# Patient Record
Sex: Female | Born: 1960 | Race: White | Hispanic: No | Marital: Married | Smoking: Current every day smoker
Health system: Southern US, Community
[De-identification: ages and names within clinical notes are randomized; demographics above are authoritative.]

## PROBLEM LIST (undated history)

## (undated) DIAGNOSIS — G459 Transient cerebral ischemic attack, unspecified: Secondary | ICD-10-CM

## (undated) DIAGNOSIS — I6529 Occlusion and stenosis of unspecified carotid artery: Secondary | ICD-10-CM

## (undated) DIAGNOSIS — I639 Cerebral infarction, unspecified: Secondary | ICD-10-CM

## (undated) DIAGNOSIS — I219 Acute myocardial infarction, unspecified: Secondary | ICD-10-CM

## (undated) DIAGNOSIS — G709 Myoneural disorder, unspecified: Secondary | ICD-10-CM

## (undated) DIAGNOSIS — I10 Essential (primary) hypertension: Secondary | ICD-10-CM

## (undated) DIAGNOSIS — M542 Cervicalgia: Secondary | ICD-10-CM

## (undated) DIAGNOSIS — M549 Dorsalgia, unspecified: Secondary | ICD-10-CM

## (undated) DIAGNOSIS — G8929 Other chronic pain: Secondary | ICD-10-CM

## (undated) HISTORY — DX: Occlusion and stenosis of unspecified carotid artery: I65.29

## (undated) HISTORY — PX: SPINAL FUSION: SHX223

## (undated) HISTORY — DX: Acute myocardial infarction, unspecified: I21.9

## (undated) HISTORY — PX: PLACEMENT OF BREAST IMPLANTS: SHX6334

## (undated) HISTORY — PX: COSMETIC SURGERY: SHX468

## (undated) HISTORY — DX: Cerebral infarction, unspecified: I63.9

## (undated) HISTORY — DX: Transient cerebral ischemic attack, unspecified: G45.9

## (undated) HISTORY — DX: Myoneural disorder, unspecified: G70.9

---

## 2015-08-09 DIAGNOSIS — M5010 Cervical disc disorder with radiculopathy, unspecified cervical region: Secondary | ICD-10-CM

## 2015-08-09 DIAGNOSIS — M5116 Intervertebral disc disorders with radiculopathy, lumbar region: Secondary | ICD-10-CM | POA: Insufficient documentation

## 2015-08-09 HISTORY — DX: Cervical disc disorder with radiculopathy, unspecified cervical region: M50.10

## 2019-03-11 DIAGNOSIS — M7060 Trochanteric bursitis, unspecified hip: Secondary | ICD-10-CM | POA: Insufficient documentation

## 2021-07-21 ENCOUNTER — Encounter (HOSPITAL_COMMUNITY): Payer: Self-pay

## 2021-07-21 ENCOUNTER — Other Ambulatory Visit: Payer: Self-pay

## 2021-07-21 ENCOUNTER — Emergency Department (HOSPITAL_COMMUNITY)
Admission: EM | Admit: 2021-07-21 | Discharge: 2021-07-22 | Disposition: A | Discharge status: Home / Self Care | Payer: Managed Care, Other (non HMO) | Attending: Emergency Medicine | Admitting: Emergency Medicine

## 2021-07-21 ENCOUNTER — Emergency Department (HOSPITAL_COMMUNITY): Payer: Managed Care, Other (non HMO)

## 2021-07-21 DIAGNOSIS — R079 Chest pain, unspecified: Secondary | ICD-10-CM | POA: Diagnosis not present

## 2021-07-21 DIAGNOSIS — R42 Dizziness and giddiness: Secondary | ICD-10-CM | POA: Diagnosis not present

## 2021-07-21 DIAGNOSIS — R110 Nausea: Secondary | ICD-10-CM | POA: Diagnosis not present

## 2021-07-21 DIAGNOSIS — I10 Essential (primary) hypertension: Secondary | ICD-10-CM | POA: Diagnosis not present

## 2021-07-21 DIAGNOSIS — Z5321 Procedure and treatment not carried out due to patient leaving prior to being seen by health care provider: Secondary | ICD-10-CM | POA: Diagnosis not present

## 2021-07-21 DIAGNOSIS — R519 Headache, unspecified: Secondary | ICD-10-CM | POA: Diagnosis present

## 2021-07-21 HISTORY — DX: Cervicalgia: M54.2

## 2021-07-21 HISTORY — DX: Other chronic pain: G89.29

## 2021-07-21 HISTORY — DX: Essential (primary) hypertension: I10

## 2021-07-21 HISTORY — DX: Dorsalgia, unspecified: M54.9

## 2021-07-21 NOTE — ED Provider Notes (Signed)
Emergency Medicine Provider Triage Evaluation Note  Samantha Russo , a 60 y.o. female  was evaluated in triage.  Pt complains of feeling unwell.  Patient states over the last 3 weeks she has had intermittent headaches, nausea, dizziness and chest pain.  Chest pain has since resolved.  Has not been seen by PCP in quite a long time.  Due to feeling unwell she went to urgent care today.  They obtained an EKG which showed some Q waves in her anterior leads.  They told her to come here to emergency department for evaluation.  She denies any paresthesias, unilateral weakness, facial droop.  No current chest pain, shortness of breath.  Unknown COVID exposures.  Review of Systems  Positive: Intermittent chest pain, dizziness, lightheadedness Negative: Fever, emesis, back pain, urinary complaints  Physical Exam  BP (!) 170/129 (BP Location: Left Arm)   Pulse (!) 113   Temp 98.8 F (37.1 C) (Oral)   Resp 16   Ht 5' 3.5" (1.613 m)   Wt 83.5 kg   LMP  (LMP Unknown)   SpO2 97%   BMI 32.08 kg/m  Gen:   Awake, no distress   Resp:  Normal effort  MSK:   Moves extremities without difficulty  Neuro:  CN 2-12 grossly intact, equal handgrip bilaterally, intact sensation Other:    Medical Decision Making  Medically screening exam initiated at 3:55 PM.  Appropriate orders placed.  Colbi Schiltz was informed that the remainder of the evaluation will be completed by another provider, this initial triage assessment does not replace that evaluation, and the importance of remaining in the ED until their evaluation is complete.  Intermittent CP, dizziness   Jennetta Flood A, PA-C 07/21/21 1556    Ernie Avena, MD 07/22/21 1445

## 2021-07-21 NOTE — ED Triage Notes (Signed)
Patient c/o headache, nausea, and dizziness x 3 weeks. Patient states that she had chest pain x 1 only approx 3 weeks ago. Patient states she has not had prescribed medications for HTN since March/2022.

## 2021-07-21 NOTE — ED Notes (Signed)
Pt sts she is leaving and will go see a cardiologist. She sts she cant wait any longer

## 2021-07-22 ENCOUNTER — Emergency Department (HOSPITAL_COMMUNITY): Payer: Managed Care, Other (non HMO)

## 2021-07-22 ENCOUNTER — Emergency Department (HOSPITAL_COMMUNITY)
Admission: EM | Admit: 2021-07-22 | Discharge: 2021-07-22 | Disposition: A | Discharge status: Home / Self Care | Payer: Managed Care, Other (non HMO) | Source: Home / Self Care

## 2021-07-22 DIAGNOSIS — R079 Chest pain, unspecified: Secondary | ICD-10-CM | POA: Insufficient documentation

## 2021-07-22 DIAGNOSIS — R42 Dizziness and giddiness: Secondary | ICD-10-CM | POA: Insufficient documentation

## 2021-07-22 DIAGNOSIS — R110 Nausea: Secondary | ICD-10-CM | POA: Insufficient documentation

## 2021-07-22 DIAGNOSIS — Z5321 Procedure and treatment not carried out due to patient leaving prior to being seen by health care provider: Secondary | ICD-10-CM | POA: Insufficient documentation

## 2021-07-22 LAB — CBC WITH DIFFERENTIAL/PLATELET
Abs Immature Granulocytes: 0.02 10*3/uL (ref 0.00–0.07)
Basophils Absolute: 0.1 10*3/uL (ref 0.0–0.1)
Basophils Relative: 1 %
Eosinophils Absolute: 0.1 10*3/uL (ref 0.0–0.5)
Eosinophils Relative: 1 %
HCT: 47 % — ABNORMAL HIGH (ref 36.0–46.0)
Hemoglobin: 15.6 g/dL — ABNORMAL HIGH (ref 12.0–15.0)
Immature Granulocytes: 0 %
Lymphocytes Relative: 34 %
Lymphs Abs: 2.7 10*3/uL (ref 0.7–4.0)
MCH: 30.4 pg (ref 26.0–34.0)
MCHC: 33.2 g/dL (ref 30.0–36.0)
MCV: 91.4 fL (ref 80.0–100.0)
Monocytes Absolute: 0.9 10*3/uL (ref 0.1–1.0)
Monocytes Relative: 11 %
Neutro Abs: 4.2 10*3/uL (ref 1.7–7.7)
Neutrophils Relative %: 53 %
Platelets: 365 10*3/uL (ref 150–400)
RBC: 5.14 MIL/uL — ABNORMAL HIGH (ref 3.87–5.11)
RDW: 13.9 % (ref 11.5–15.5)
WBC: 7.9 10*3/uL (ref 4.0–10.5)
nRBC: 0 % (ref 0.0–0.2)

## 2021-07-22 LAB — COMPREHENSIVE METABOLIC PANEL
ALT: 22 U/L (ref 0–44)
AST: 20 U/L (ref 15–41)
Albumin: 4 g/dL (ref 3.5–5.0)
Alkaline Phosphatase: 90 U/L (ref 38–126)
Anion gap: 11 (ref 5–15)
BUN: 15 mg/dL (ref 6–20)
CO2: 20 mmol/L — ABNORMAL LOW (ref 22–32)
Calcium: 9.3 mg/dL (ref 8.9–10.3)
Chloride: 106 mmol/L (ref 98–111)
Creatinine, Ser: 0.59 mg/dL (ref 0.44–1.00)
GFR, Estimated: >60 mL/min (ref >60)
Glucose, Bld: 107 mg/dL — ABNORMAL HIGH (ref 70–99)
Potassium: 4.4 mmol/L (ref 3.5–5.1)
Sodium: 137 mmol/L (ref 135–145)
Total Bilirubin: 0.5 mg/dL (ref 0.3–1.2)
Total Protein: 7.4 g/dL (ref 6.5–8.1)

## 2021-07-22 LAB — TROPONIN I (HIGH SENSITIVITY): Troponin I (High Sensitivity): 4 ng/L (ref <18)

## 2021-07-22 NOTE — ED Triage Notes (Signed)
Pt reports constant nausea and dizziness x 2.5 weeks, having intermittent chest pain with radiation to back for same amount of time. Seen for same yesterday but LWBS. Took a Valium last night with some relief in pain.

## 2021-07-22 NOTE — ED Provider Notes (Addendum)
Emergency Medicine Provider Triage Evaluation Note  Samantha Russo , a 60 y.o. female  was evaluated in triage.  Pt complains of persistent dizziness and nausea with intermittent CP/back pain. This has been happening for 2-3 weeks. Went to [facility] yesterday, but didn't wait to be seen. EKG was abnormal with UC yesterday. Dizziness is described as being off balance.   Pt with h/o heart problems, was on spironolactone and bistolic in [place], has not been able to get meds refilled due to no dr in [facility]. Has been out for 2 months.   Review of Systems  Positive: Cp, dizziness, nausea Negative: fever  Physical Exam  LMP  (LMP Unknown)  Gen:   Awake, no distress   Resp:  Normal effort  MSK:   Moves extremities without difficulty  Other:  Eomi. Alert and oriented. Moving all extrmeities equally  Medical Decision Making  Medically screening exam initiated at 9:56 AM.  Appropriate orders placed.  Samantha Russo was informed that the remainder of the evaluation will be completed by another provider, this initial triage assessment does not replace that evaluation, and the importance of remaining in the ED until their evaluation is complete.  Labs, cxr, ekg, and ct head ordered   Samantha Apley, PA-C 07/22/21 1007    Russo, Samantha Jiggetts, PA-C 07/22/21 1008    Tegeler, Canary Brim, MD 07/22/21 [phone]

## 2021-07-22 NOTE — ED Notes (Signed)
Pt handed over their labels and stated they were leaving

## 2021-07-24 DIAGNOSIS — G8929 Other chronic pain: Secondary | ICD-10-CM | POA: Insufficient documentation

## 2021-07-24 DIAGNOSIS — I10 Essential (primary) hypertension: Secondary | ICD-10-CM | POA: Insufficient documentation

## 2021-07-24 DIAGNOSIS — M542 Cervicalgia: Secondary | ICD-10-CM | POA: Insufficient documentation

## 2021-07-25 ENCOUNTER — Ambulatory Visit (INDEPENDENT_AMBULATORY_CARE_PROVIDER_SITE_OTHER): Payer: Managed Care, Other (non HMO) | Admitting: Cardiology

## 2021-07-25 ENCOUNTER — Encounter: Payer: Self-pay | Admitting: Cardiology

## 2021-07-25 ENCOUNTER — Other Ambulatory Visit: Payer: Self-pay

## 2021-07-25 VITALS — BP 156/84 | HR 104 | Ht 64.0 in | Wt 185.0 lb

## 2021-07-25 DIAGNOSIS — I10 Essential (primary) hypertension: Secondary | ICD-10-CM | POA: Diagnosis not present

## 2021-07-25 DIAGNOSIS — R0789 Other chest pain: Secondary | ICD-10-CM

## 2021-07-25 DIAGNOSIS — E782 Mixed hyperlipidemia: Secondary | ICD-10-CM

## 2021-07-25 DIAGNOSIS — F17200 Nicotine dependence, unspecified, uncomplicated: Secondary | ICD-10-CM

## 2021-07-25 DIAGNOSIS — R079 Chest pain, unspecified: Secondary | ICD-10-CM | POA: Diagnosis not present

## 2021-07-25 DIAGNOSIS — IMO0001 Reserved for inherently not codable concepts without codable children: Secondary | ICD-10-CM

## 2021-07-25 HISTORY — DX: Reserved for inherently not codable concepts without codable children: IMO0001

## 2021-07-25 HISTORY — DX: Other chest pain: R07.89

## 2021-07-25 HISTORY — DX: Nicotine dependence, unspecified, uncomplicated: F17.200

## 2021-07-25 MED ORDER — ASPIRIN EC 81 MG PO TBEC: 81.0000 mg | DELAYED_RELEASE_TABLET | Freq: Every day | ORAL | 3 refills | Status: DC

## 2021-07-25 MED ORDER — NEBIVOLOL HCL 5 MG PO TABS: 5.0000 mg | ORAL_TABLET | Freq: Every day | ORAL | 1 refills | Status: DC

## 2021-07-25 MED ORDER — NITROGLYCERIN 0.4 MG/SPRAY TL SOLN: 1.0000 | 12 refills | Status: DC | PRN

## 2021-07-25 MED ORDER — SPIRONOLACTONE 25 MG PO TABS: 25.0000 mg | ORAL_TABLET | Freq: Every day | ORAL | 1 refills | Status: DC

## 2021-07-25 MED ORDER — METOPROLOL TARTRATE 100 MG PO TABS
ORAL_TABLET | ORAL | 0 refills | Status: DC
Start: 2021-07-25 — End: 2021-07-25

## 2021-07-25 MED ORDER — ASPIRIN EC 81 MG PO TBEC: 81.0000 mg | DELAYED_RELEASE_TABLET | Freq: Every day | ORAL | 3 refills | Status: AC

## 2021-07-25 MED ORDER — METOPROLOL TARTRATE 100 MG PO TABS: ORAL_TABLET | ORAL | 0 refills | Status: DC

## 2021-07-25 NOTE — Progress Notes (Signed)
Cardiology Consultation:    Date:  07/25/2021   ID:  Samantha Russo, DOB [birth year], MRN [id number]  PCP:  Pcp, No  Cardiologist:  Gypsy Balsam, MD   Referring MD: No ref. provider found   Chief Complaint  Patient presents with   back and chest pain   Dizziness    Since 06/2021    History of Present Illness:    Samantha Russo is a 60 y.o. female who is being seen today for the evaluation of chest pain dizziness at the request of No ref. provider found.  She recently relocated from [place] in December.  She does have chronic neck pain that required surgery as well as back pain.  She is a chronic smoker smoke about 1 pack/day.  Couple weeks ago she was working hard she was going up and down stairs many times then she woke up in the middle of the night with severe chest pain.  There was some sweating associated with this sensation.  She interestingly decided to was this is with death which she did and eventually about an hour or maybe an hour and a half later the sensation subsided.  She did have going to the emergency room a couple days later because since that time she is feeling very dizzy unsteady and complain of having some uneasy sensation in the chest not related to exercise.  In emergency room troponin I has been checked she also get EKG done initially by primary care physician which showed Q waves inferiorly but then she got EKG done in the emergency room which raises suspicion for anterior septal wall myocardial infarction.  There were no acute changes at that time. She smoke about 1 pack/day She does not exercise on the regular basis She has been followed by cardiologist in [place] apparently in 2015 she got stress test done as well as echocardiogram and everything seems to be good She was told she have hypertension most of this is whitecoat as well as hypertension related to pain. Since the time she got episode a couple weeks ago with a chest pain she got  elevated blood pressure.  Past Medical History:  Diagnosis Date   Chronic neck and back pain    Hypertension     Past Surgical History:  Procedure Laterality Date   PLACEMENT OF BREAST IMPLANTS     SPINAL FUSION     2012 2013    Current Medications: Current Meds  Medication Sig   metoprolol tartrate (LOPRESSOR) 100 MG tablet Take 2 hours prior to CT   nebivolol (BYSTOLIC) 5 MG tablet Take 5 mg by mouth daily.   spironolactone (ALDACTONE) 25 MG tablet Take 25 mg by mouth daily.   Vitamin D, Ergocalciferol, (DRISDOL) 1.25 MG (50000 UNIT) CAPS capsule Take 50,000 Units by mouth every 3 (three) days.   [DISCONTINUED] aspirin EC 81 MG tablet Take 1 tablet (81 mg total) by mouth daily. Swallow whole.   [DISCONTINUED] nitroGLYCERIN (NITROLINGUAL) 0.4 MG/SPRAY spray Place 1 spray under the tongue every 5 (five) minutes x 3 doses as needed for chest pain.     Allergies:   Patient has no known allergies.   Social History   Socioeconomic History   Marital status: Married    Spouse name: Not on file   Number of children: Not on file   Years of education: Not on file   Highest education level: Not on file  Occupational History   Not on file  Tobacco Use   Smoking status:  Every Day    Packs/day: 0.15    Types: Cigarettes   Smokeless tobacco: Never  Vaping Use   Vaping Use: Never used  Substance and Sexual Activity   Alcohol use: Yes   Drug use: Never   Sexual activity: Not on file  Other Topics Concern   Not on file  Social History Narrative   Not on file   Social Determinants of Health   Financial Resource Strain: Not on file  Food Insecurity: Not on file  Transportation Needs: Not on file  Physical Activity: Not on file  Stress: Not on file  Social Connections: Not on file     Family History: The patient's family history includes Diabetes in her father; Heart failure in her father. ROS:   Please see the history of present illness.    All 14 point review of  systems negative except as described per history of present illness.  EKGs/Labs/Other Studies Reviewed:    The following studies were reviewed today: Normal sinus rhythm normal P interval Q waves inferiorly  Another EKG showed normal sinus rhythm normal P interval poor of progression anterior precordium  EKG:  EKG is  ordered today.  The ekg ordered today demonstrates Q waves To V3 raising suspicion for anteroseptal wall myocardial infarction.  Recent Labs: 07/22/2021: ALT 22; BUN 15; Creatinine, Ser 0.59; Hemoglobin 15.6; Platelets 365; Potassium 4.4; Sodium 137  Recent Lipid Panel No results found for: CHOL, TRIG, HDL, CHOLHDL, VLDL, LDLCALC, LDLDIRECT  Physical Exam:    VS:  BP (!) 156/84 (BP Location: Right Arm, Patient Position: Sitting)   Pulse (!) 104   Ht 5\' 4"  (1.626 m)   Wt 185 lb (83.9 kg)   LMP  (LMP Unknown)   SpO2 96%   BMI 31.76 kg/m     Wt Readings from Last 3 Encounters:  07/25/21 185 lb (83.9 kg)  07/21/21 184 lb (83.5 kg)     GEN:  Well nourished, well developed in no acute distress HEENT: Normal NECK: No JVD; No carotid bruits LYMPHATICS: No lymphadenopathy CARDIAC: RRR, no murmurs, no rubs, no gallops RESPIRATORY:  Clear to auscultation without rales, wheezing or rhonchi  ABDOMEN: Soft, non-tender, non-distended MUSCULOSKELETAL:  No edema; No deformity  SKIN: Warm and dry NEUROLOGIC:  Alert and oriented x 3 PSYCHIATRIC:  Normal affect   ASSESSMENT:    1. Chest pain, unspecified type   2. Hypertension, unspecified type   3. Atypical chest pain   4. Smoking    PLAN:    In order of problems listed above:  Chest pain 1 episode happened in the middle of the night after that she got troponin I done but more than a week later that troponin I was normal.  I asked her to start taking 1 baby aspirin every single day.  I will give her a refill nitroglycerin spray she is already on beta-blocker which I will continue.  I will schedule her to have  echocardiogram to see if she truly got myocardial infarction as well as coronary CT angio to see if she is at risk of having another coronary event.  And assess overall coronary artery status. Essential hypertension: Blood pressure elevated today asking to check blood pressure on the regular basis will look at the echocardiogram to look for evidence of left ventricle hypertrophy and treat accordingly. Smoking obviously huge problem I spent at least 10 minutes talking about this I strongly recommended to quit. Cholesterol status unknown we will schedule her to have fasting  lipid profile done.   Medication Adjustments/Labs and Tests Ordered: Current medicines are reviewed at length with the patient today.  Concerns regarding medicines are outlined above.  Orders Placed This Encounter  Procedures   CT CORONARY MORPH W/CTA COR W/SCORE W/CA W/CM &/OR WO/CM   Lipid panel   Basic metabolic panel   EKG 12-Lead   ECHOCARDIOGRAM COMPLETE   Meds ordered this encounter  Medications   DISCONTD: nitroGLYCERIN (NITROLINGUAL) 0.4 MG/SPRAY spray    Sig: Place 1 spray under the tongue every 5 (five) minutes x 3 doses as needed for chest pain.    Dispense:  12 g    Refill:  12   DISCONTD: aspirin EC 81 MG tablet    Sig: Take 1 tablet (81 mg total) by mouth daily. Swallow whole.    Dispense:  90 tablet    Refill:  3   nitroGLYCERIN (NITROLINGUAL) 0.4 MG/SPRAY spray    Sig: Place 1 spray under the tongue every 5 (five) minutes x 3 doses as needed for chest pain (Do not exceed 3 times in a day, if chest pain not resolved or worsening please call 911).    Dispense:  12 g    Refill:  12   metoprolol tartrate (LOPRESSOR) 100 MG tablet    Sig: Take 2 hours prior to CT    Dispense:  1 tablet    Refill:  0    Signed, Georgeanna Lea, MD, [facility]. 07/25/2021 12:28 PM    [facility]

## 2021-07-25 NOTE — Patient Instructions (Addendum)
Medication Instructions:  Your physician has recommended you make the following change in your medication:  START:  Nitroglycerin 0.4 mg take one spray under your tongue every 5 minutes up to three times as needed for chest pain.   START: Aspirin 81 mg once daily.  *If you need a refill on your cardiac medications before your next appointment, please call your pharmacy*   Lab Work: Your physician recommends that you return for lab work in: TODAY: Lipid Profile and BMP If you have labs (blood work) drawn today and your tests are completely normal, you will receive your results only by: MyChart Message (if you have MyChart) OR A paper copy in the mail If you have any lab test that is abnormal or we need to change your treatment, we will call you to review the results.   Testing/Procedures: Your physician has requested that you have an echocardiogram. Echocardiography is a painless test that uses sound waves to create images of your heart. It provides your doctor with information about the size and shape of your heart and how well your heart's chambers and valves are working. This procedure takes approximately one hour. There are no restrictions for this procedure.     Your cardiac CT will be scheduled at one of the below locations:   [facility] [address] [place], [place] [address] [phone]   If scheduled at [facility], please arrive at the [facility] main entrance (entrance A) of [facility] 30 minutes prior to test start time. Proceed to the [facility] Radiology Department (first floor) to check-in and test prep.    Please follow these instructions carefully (unless otherwise directed):  On the Night Before the Test: Be sure to Drink plenty of water. Do not consume any caffeinated/decaffeinated beverages or chocolate 12 hours prior to your test. Do not take any antihistamines 12 hours prior to your test.  On the Day of the  Test: Drink plenty of water until 1 hour prior to the test. Do not eat any food 4 hours prior to the test. You may take your regular medications prior to the test.  Take metoprolol (Lopressor) two hours prior to test. FEMALES- please wear underwire-free bra if available, avoid dresses & tight clothing       After the Test: Drink plenty of water. After receiving IV contrast, you may experience a mild flushed feeling. This is normal. On occasion, you may experience a mild rash up to 24 hours after the test. This is not dangerous. If this occurs, you can take Benadryl 25 mg and increase your fluid intake. If you experience trouble breathing, this can be serious. If it is severe call 911 IMMEDIATELY. If it is mild, please call our office. If you take any of these medications: Glipizide/Metformin, Avandament, Glucavance, please do not take 48 hours after completing test unless otherwise instructed.  Please allow 2-4 weeks for scheduling of routine cardiac CTs. Some insurance companies require a pre-authorization which may delay scheduling of this test.   For non-scheduling related questions, please contact the cardiac imaging nurse navigator should you have any questions/concerns: Rockwell Alexandria, Cardiac Imaging Nurse Navigator Larey Brick, Cardiac Imaging Nurse Navigator [facility] Direct Office Dial: [phone]   For scheduling needs, including cancellations and rescheduling, please call [place], [phone].     Follow-Up: At [facility], you and your health needs are our priority.  As part of our continuing mission to provide you with  exceptional heart care, we have created designated Provider Care Teams.  These Care Teams include your primary Cardiologist (physician) and Advanced Practice Providers (APPs -  Physician Assistants and Nurse Practitioners) who all work together to provide you with the care you need, when you need it.  We recommend  signing up for the patient portal called "MyChart".  Sign up information is provided on this After Visit Summary.  MyChart is used to connect with patients for Virtual Visits (Telemedicine).  Patients are able to view lab/test results, encounter notes, upcoming appointments, etc.  Non-urgent messages can be sent to your provider as well.   To learn more about what you can do with MyChart, go to [email or web address].    Your next appointment:   6 week(s)  The format for your next appointment:   In Person  Provider:   Gypsy Balsam, MD   Other Instructions

## 2021-07-26 LAB — LIPID PANEL
Chol/HDL Ratio: 5.3 ratio — ABNORMAL HIGH (ref 0.0–4.4)
Cholesterol, Total: 299 mg/dL — ABNORMAL HIGH (ref 100–199)
HDL: 56 mg/dL (ref >39)
LDL Chol Calc (NIH): 198 mg/dL — ABNORMAL HIGH (ref 0–99)
Triglycerides: 232 mg/dL — ABNORMAL HIGH (ref 0–149)
VLDL Cholesterol Cal: 45 mg/dL — ABNORMAL HIGH (ref 5–40)

## 2021-07-26 LAB — BASIC METABOLIC PANEL
BUN/Creatinine Ratio: 25 — ABNORMAL HIGH (ref 9–23)
BUN: 14 mg/dL (ref 6–24)
CO2: 20 mmol/L (ref 20–29)
Calcium: 9.6 mg/dL (ref 8.7–10.2)
Chloride: 101 mmol/L (ref 96–106)
Creatinine, Ser: 0.55 mg/dL — ABNORMAL LOW (ref 0.57–1.00)
Glucose: 79 mg/dL (ref 65–99)
Potassium: 4.8 mmol/L (ref 3.5–5.2)
Sodium: 138 mmol/L (ref 134–144)
eGFR: 106 mL/min/{1.73_m2} (ref >59)

## 2021-07-27 MED ORDER — ROSUVASTATIN CALCIUM 20 MG PO TABS: 20.0000 mg | ORAL_TABLET | Freq: Every day | ORAL | 3 refills | Status: DC

## 2021-07-27 NOTE — Addendum Note (Signed)
Addended by: Eleonore Chiquito on: 07/27/2021 01:20 PM   Modules accepted: Orders

## 2021-08-02 ENCOUNTER — Telehealth (HOSPITAL_COMMUNITY): Payer: Self-pay | Admitting: *Deleted

## 2021-08-02 NOTE — Telephone Encounter (Signed)
Reaching out to patient to offer assistance regarding upcoming cardiac imaging study; pt verbalizes understanding of appt date/time, parking situation and where to check in, pre-test NPO status and medications ordered, and verified current allergies; name and call back number provided for further questions should they arise 

## 2021-08-03 ENCOUNTER — Other Ambulatory Visit: Payer: Self-pay | Admitting: Cardiovascular Disease

## 2021-08-03 ENCOUNTER — Ambulatory Visit (HOSPITAL_COMMUNITY)
Admission: RE | Admit: 2021-08-03 | Discharge: 2021-08-03 | Disposition: A | Discharge status: Home / Self Care | Payer: Managed Care, Other (non HMO) | Source: Ambulatory Visit | Attending: Cardiology | Admitting: Cardiology

## 2021-08-03 ENCOUNTER — Other Ambulatory Visit: Payer: Self-pay

## 2021-08-03 ENCOUNTER — Ambulatory Visit (HOSPITAL_COMMUNITY)
Admission: RE | Admit: 2021-08-03 | Discharge: 2021-08-03 | Disposition: A | Discharge status: Home / Self Care | Payer: Managed Care, Other (non HMO) | Source: Ambulatory Visit | Attending: Cardiovascular Disease | Admitting: Cardiovascular Disease

## 2021-08-03 DIAGNOSIS — R079 Chest pain, unspecified: Secondary | ICD-10-CM | POA: Insufficient documentation

## 2021-08-03 DIAGNOSIS — R931 Abnormal findings on diagnostic imaging of heart and coronary circulation: Secondary | ICD-10-CM

## 2021-08-03 DIAGNOSIS — I2510 Atherosclerotic heart disease of native coronary artery without angina pectoris: Secondary | ICD-10-CM

## 2021-08-03 DIAGNOSIS — R072 Precordial pain: Secondary | ICD-10-CM

## 2021-08-03 MED ORDER — DILTIAZEM HCL 25 MG/5ML IV SOLN: 10.0000 mg | INTRAVENOUS | Status: DC | PRN

## 2021-08-03 MED ORDER — NITROGLYCERIN 0.4 MG SL SUBL
SUBLINGUAL_TABLET | SUBLINGUAL | Status: AC
  Filled 2021-08-03: qty 2

## 2021-08-03 MED ORDER — METOPROLOL TARTRATE 5 MG/5ML IV SOLN
INTRAVENOUS | Status: AC
  Administered 2021-08-03: 10 mg via INTRAVENOUS
  Filled 2021-08-03: qty 20

## 2021-08-03 MED ORDER — METOPROLOL TARTRATE 5 MG/5ML IV SOLN
10.0000 mg | INTRAVENOUS | Status: DC | PRN
  Administered 2021-08-03: 10 mg via INTRAVENOUS

## 2021-08-03 MED ORDER — NITROGLYCERIN 0.4 MG SL SUBL: 0.8000 mg | SUBLINGUAL_TABLET | Freq: Once | SUBLINGUAL | Status: DC

## 2021-08-03 MED ORDER — IOHEXOL 350 MG/ML SOLN
95.0000 mL | Freq: Once | INTRAVENOUS | Status: AC | PRN
  Administered 2021-08-03: 95 mL via INTRAVENOUS

## 2021-08-03 NOTE — Progress Notes (Signed)
CT FFR ordered.   Gerri Spore T. Flora Lipps, MD, [facility]  [facility]  [address] [place], [place] [address] [phone]  3:23 PM

## 2021-08-04 ENCOUNTER — Telehealth: Payer: Self-pay

## 2021-08-04 DIAGNOSIS — I2510 Atherosclerotic heart disease of native coronary artery without angina pectoris: Secondary | ICD-10-CM | POA: Diagnosis not present

## 2021-08-04 NOTE — Telephone Encounter (Signed)
-----   Message from Garwin Brothers, MD sent at 08/04/2021 12:19 PM EDT ----- If there are no contraindications patient must take a coated baby aspirin daily.  Nitroglycerin sublingual.  Advised patient about this medicine.  Needs to see Dr. Bing Matter in the next few days for abnormal stress test.  Needs to go to the emergency room for any significant chest pain.  Copy primary care. Garwin Brothers, MD 08/04/2021 12:19 PM

## 2021-08-04 NOTE — Telephone Encounter (Signed)
Spoke with patient regarding results and recommendation.  Patient verbalizes understanding and is agreeable to plan of care. Advised patient to call back with any issues or concerns.  

## 2021-08-07 ENCOUNTER — Other Ambulatory Visit: Payer: Self-pay

## 2021-08-07 ENCOUNTER — Ambulatory Visit (HOSPITAL_COMMUNITY): Payer: Managed Care, Other (non HMO) | Attending: Cardiology

## 2021-08-07 DIAGNOSIS — R079 Chest pain, unspecified: Secondary | ICD-10-CM

## 2021-08-07 LAB — ECHOCARDIOGRAM COMPLETE
Area-P 1/2: 3.97 cm2
S' Lateral: 2.9 cm

## 2021-08-08 ENCOUNTER — Ambulatory Visit: Payer: Managed Care, Other (non HMO) | Admitting: Cardiology

## 2021-08-08 ENCOUNTER — Encounter: Payer: Self-pay | Admitting: Cardiology

## 2021-08-08 ENCOUNTER — Ambulatory Visit (INDEPENDENT_AMBULATORY_CARE_PROVIDER_SITE_OTHER): Payer: Managed Care, Other (non HMO) | Admitting: Cardiology

## 2021-08-08 VITALS — BP 136/80 | HR 113 | Ht 63.0 in | Wt 185.0 lb

## 2021-08-08 DIAGNOSIS — I10 Essential (primary) hypertension: Secondary | ICD-10-CM | POA: Diagnosis not present

## 2021-08-08 DIAGNOSIS — Z8669 Personal history of other diseases of the nervous system and sense organs: Secondary | ICD-10-CM

## 2021-08-08 DIAGNOSIS — F17200 Nicotine dependence, unspecified, uncomplicated: Secondary | ICD-10-CM

## 2021-08-08 DIAGNOSIS — I2510 Atherosclerotic heart disease of native coronary artery without angina pectoris: Secondary | ICD-10-CM

## 2021-08-08 NOTE — Patient Instructions (Signed)
Medication Instructions:  Your physician recommends that you continue on your current medications as directed. Please refer to the Current Medication list given to you today.  *If you need a refill on your cardiac medications before your next appointment, please call your pharmacy*   Lab Work: Your physician recommends that you return for lab work in:  2-3 weeks:  Lipids - please come fasting If you have labs (blood work) drawn today and your tests are completely normal, you will receive your results only by: MyChart Message (if you have MyChart) OR A paper copy in the mail If you have any lab test that is abnormal or we need to change your treatment, we will call you to review the results.   Testing/Procedures: Your physician has requested that you have a carotid duplex. This test is an ultrasound of the carotid arteries in your neck. It looks at blood flow through these arteries that supply the brain with blood. Allow one hour for this exam. There are no restrictions or special instructions.    Follow-Up: At [facility], you and your health needs are our priority.  As part of our continuing mission to provide you with exceptional heart care, we have created designated Provider Care Teams.  These Care Teams include your primary Cardiologist (physician) and Advanced Practice Providers (APPs -  Physician Assistants and Nurse Practitioners) who all work together to provide you with the care you need, when you need it.  We recommend signing up for the patient portal called "MyChart".  Sign up information is provided on this After Visit Summary.  MyChart is used to connect with patients for Virtual Visits (Telemedicine).  Patients are able to view lab/test results, encounter notes, upcoming appointments, etc.  Non-urgent messages can be sent to your provider as well.   To learn more about what you can do with MyChart, go to [email or web address].    Your next appointment:   3  month(s)  The format for your next appointment:   In Person  Provider:   Gypsy Balsam, MD   Other Instructions

## 2021-08-08 NOTE — Progress Notes (Signed)
Cardiology Office Note:    Date:  08/08/2021   ID:  Samantha Russo, DOB 1961-01-14, MRN [id number]  PCP:  Pcp, No  Cardiologist:  Gypsy Balsam, MD    Referring MD: No ref. provider found   Chief Complaint  Patient presents with   Results    History of Present Illness:    Samantha Russo is a 60 y.o. female who is originally from [place] recently relocated to the area.  She was referred to [place] because of episode of chest pain.  She does have multiple risk factors for coronary artery disease including hypertension, dyslipidemia with very high LDL which was not managed, smoking.  She did have echocardiogram done which showed left ventricle hypertrophy but no segmental wall motion abnormalities, coronary CT angio showed completely occluded right coronary artery with nonobstructive mild to moderate disease elsewhere.  She comes today to my office today for follow-up.  Overall she is doing well.  Denies have any chest pain tightness squeezing pressure burning chest.  She is still afraid to exercise because of chest pain that she had before.  But is enthusiastic and happy about what I told her about finding on the coronary CT angio and echocardiogram  Past Medical History:  Diagnosis Date   Atypical chest pain 07/25/2021   Cervical disc disorder with radiculopathy 08/09/2015   Chronic neck and back pain    Hypertension    Smoking 07/25/2021    Past Surgical History:  Procedure Laterality Date   PLACEMENT OF BREAST IMPLANTS     SPINAL FUSION     2012 2013    Current Medications: Current Meds  Medication Sig   aspirin EC 81 MG tablet Take 1 tablet (81 mg total) by mouth daily. Swallow whole.   nebivolol (BYSTOLIC) 5 MG tablet Take 1 tablet (5 mg total) by mouth daily.   nicotine (NICODERM CQ - DOSED IN MG/24 HOURS) 14 mg/24hr patch Place 1 patch onto the skin daily.   nitroGLYCERIN (NITROLINGUAL) 0.4 MG/SPRAY spray Place 1 spray under the tongue every 5 (five) minutes x 3  doses as needed for chest pain (Do not exceed 3 times in a day and Call 911 if chest pain continues or worsening).   rosuvastatin (CRESTOR) 20 MG tablet Take 1 tablet (20 mg total) by mouth daily.   spironolactone (ALDACTONE) 25 MG tablet Take 1 tablet (25 mg total) by mouth daily.   Vitamin D, Ergocalciferol, (DRISDOL) 1.25 MG (50000 UNIT) CAPS capsule Take 50,000 Units by mouth every 3 (three) days.   [DISCONTINUED] nebivolol (BYSTOLIC) 5 MG tablet Take 5 mg by mouth daily.     Allergies:   Patient has no known allergies.   Social History   Socioeconomic History   Marital status: Married    Spouse name: Not on file   Number of children: Not on file   Years of education: Not on file   Highest education level: Not on file  Occupational History   Not on file  Tobacco Use   Smoking status: Every Day    Packs/day: 0.15    Types: Cigarettes   Smokeless tobacco: Never  Vaping Use   Vaping Use: Never used  Substance and Sexual Activity   Alcohol use: Yes   Drug use: Never   Sexual activity: Not on file  Other Topics Concern   Not on file  Social History Narrative   Not on file   Social Determinants of Health   Financial Resource Strain: Not on file  Food  Insecurity: Not on file  Transportation Needs: Not on file  Physical Activity: Not on file  Stress: Not on file  Social Connections: Not on file     Family History: The patient's family history includes Diabetes in her father; Heart failure in her father. ROS:   Please see the history of present illness.    All 14 point review of systems negative except as described per history of present illness  EKGs/Labs/Other Studies Reviewed:      Recent Labs: 07/22/2021: ALT 22; Hemoglobin 15.6; Platelets 365 07/25/2021: BUN 14; Creatinine, Ser 0.55; Potassium 4.8; Sodium 138  Recent Lipid Panel    Component Value Date/Time   CHOL 299 (H) 07/25/2021 1224   TRIG 232 (H) 07/25/2021 1224   HDL 56 07/25/2021 1224   CHOLHDL  5.3 (H) 07/25/2021 1224   LDLCALC 198 (H) 07/25/2021 1224    Physical Exam:    VS:  BP 136/80 (BP Location: Right Arm, Patient Position: Sitting)   Pulse (!) 113   Ht 5\' 3"  (1.6 m)   Wt 185 lb (83.9 kg)   LMP  (LMP Unknown)   SpO2 97%   BMI 32.77 kg/m     Wt Readings from Last 3 Encounters:  08/08/21 185 lb (83.9 kg)  07/25/21 185 lb (83.9 kg)  07/21/21 184 lb (83.5 kg)     GEN:  Well nourished, well developed in no acute distress HEENT: Normal NECK: No JVD; No carotid bruits LYMPHATICS: No lymphadenopathy CARDIAC: RRR, no murmurs, no rubs, no gallops RESPIRATORY:  Clear to auscultation without rales, wheezing or rhonchi  ABDOMEN: Soft, non-tender, non-distended MUSCULOSKELETAL:  No edema; No deformity  SKIN: Warm and dry LOWER EXTREMITIES: no swelling NEUROLOGIC:  Alert and oriented x 3 PSYCHIATRIC:  Normal affect   ASSESSMENT:    1. Smoking   2. Coronary artery disease involving native coronary artery of native heart without angina pectoris   3. Primary hypertension    PLAN:    In order of problems listed above:  Coronary artery disease in form of completely occluded right coronary artery based on coronary CT angio.  She is already on antiplatelet therapy which I will continue, he is already on moderate intensity statin which I will continue.  We will check her fasting lipid profile in about 4 weeks. She described to strange episode what appears to be suspicion for amaurosis fugax.  I will ask her to have carotic ultrasound done make sure we not dealing with any carotic arterial disease.  She described episode of complete blindness lasting only for split-second. Essential hypertension blood pressure well controlled trolled right now.  We will continue present management. Dyslipidemia: We will check a fasting epidural profile in 2 weeks   Medication Adjustments/Labs and Tests Ordered: Current medicines are reviewed at length with the patient today.  Concerns  regarding medicines are outlined above.  No orders of the defined types were placed in this encounter.  Medication changes: No orders of the defined types were placed in this encounter.   Signed, 07/23/21, MD, [facility] 08/08/2021 2:17 PM    [facility]

## 2021-08-10 ENCOUNTER — Ambulatory Visit (HOSPITAL_COMMUNITY): Payer: Managed Care, Other (non HMO)

## 2021-08-18 ENCOUNTER — Inpatient Hospital Stay (HOSPITAL_COMMUNITY): Payer: Managed Care, Other (non HMO)

## 2021-08-18 ENCOUNTER — Telehealth: Payer: Self-pay | Admitting: Cardiology

## 2021-08-18 ENCOUNTER — Encounter (HOSPITAL_COMMUNITY): Payer: Self-pay | Admitting: Emergency Medicine

## 2021-08-18 ENCOUNTER — Encounter (HOSPITAL_COMMUNITY): Payer: Self-pay

## 2021-08-18 ENCOUNTER — Other Ambulatory Visit: Payer: Self-pay

## 2021-08-18 ENCOUNTER — Ambulatory Visit (HOSPITAL_COMMUNITY)
Admission: RE | Admit: 2021-08-18 | Discharge: 2021-08-18 | Disposition: A | Discharge status: Home / Self Care | Payer: Managed Care, Other (non HMO) | Source: Ambulatory Visit | Attending: Cardiovascular Disease | Admitting: Cardiovascular Disease

## 2021-08-18 ENCOUNTER — Other Ambulatory Visit: Payer: Self-pay | Admitting: Vascular Surgery

## 2021-08-18 ENCOUNTER — Inpatient Hospital Stay (HOSPITAL_COMMUNITY)
Admission: EM | Admit: 2021-08-18 | Discharge: 2021-08-19 | DRG: 068 | Disposition: A | Discharge status: Home / Self Care | Payer: Managed Care, Other (non HMO) | Attending: Family Medicine | Admitting: Family Medicine

## 2021-08-18 DIAGNOSIS — Z7982 Long term (current) use of aspirin: Secondary | ICD-10-CM

## 2021-08-18 DIAGNOSIS — E7800 Pure hypercholesterolemia, unspecified: Secondary | ICD-10-CM | POA: Diagnosis not present

## 2021-08-18 DIAGNOSIS — F17210 Nicotine dependence, cigarettes, uncomplicated: Secondary | ICD-10-CM | POA: Diagnosis present

## 2021-08-18 DIAGNOSIS — I10 Essential (primary) hypertension: Secondary | ICD-10-CM | POA: Diagnosis present

## 2021-08-18 DIAGNOSIS — G453 Amaurosis fugax: Secondary | ICD-10-CM | POA: Diagnosis not present

## 2021-08-18 DIAGNOSIS — I252 Old myocardial infarction: Secondary | ICD-10-CM

## 2021-08-18 DIAGNOSIS — I2582 Chronic total occlusion of coronary artery: Secondary | ICD-10-CM | POA: Diagnosis present

## 2021-08-18 DIAGNOSIS — Z79899 Other long term (current) drug therapy: Secondary | ICD-10-CM

## 2021-08-18 DIAGNOSIS — Z7902 Long term (current) use of antithrombotics/antiplatelets: Secondary | ICD-10-CM | POA: Diagnosis not present

## 2021-08-18 DIAGNOSIS — Z8669 Personal history of other diseases of the nervous system and sense organs: Secondary | ICD-10-CM

## 2021-08-18 DIAGNOSIS — Z833 Family history of diabetes mellitus: Secondary | ICD-10-CM

## 2021-08-18 DIAGNOSIS — I6521 Occlusion and stenosis of right carotid artery: Secondary | ICD-10-CM | POA: Diagnosis present

## 2021-08-18 DIAGNOSIS — Z20822 Contact with and (suspected) exposure to covid-19: Secondary | ICD-10-CM | POA: Diagnosis present

## 2021-08-18 DIAGNOSIS — I219 Acute myocardial infarction, unspecified: Secondary | ICD-10-CM | POA: Diagnosis not present

## 2021-08-18 DIAGNOSIS — R42 Dizziness and giddiness: Secondary | ICD-10-CM | POA: Diagnosis present

## 2021-08-18 DIAGNOSIS — Z8249 Family history of ischemic heart disease and other diseases of the circulatory system: Secondary | ICD-10-CM

## 2021-08-18 DIAGNOSIS — E785 Hyperlipidemia, unspecified: Secondary | ICD-10-CM | POA: Diagnosis present

## 2021-08-18 DIAGNOSIS — Z720 Tobacco use: Secondary | ICD-10-CM

## 2021-08-18 DIAGNOSIS — Z981 Arthrodesis status: Secondary | ICD-10-CM

## 2021-08-18 DIAGNOSIS — I2510 Atherosclerotic heart disease of native coronary artery without angina pectoris: Secondary | ICD-10-CM | POA: Diagnosis present

## 2021-08-18 LAB — CBC WITH DIFFERENTIAL/PLATELET
Abs Immature Granulocytes: 0.03 10*3/uL (ref 0.00–0.07)
Basophils Absolute: 0.1 10*3/uL (ref 0.0–0.1)
Basophils Relative: 1 %
Eosinophils Absolute: 0.1 10*3/uL (ref 0.0–0.5)
Eosinophils Relative: 1 %
HCT: 43.2 % (ref 36.0–46.0)
Hemoglobin: 14.2 g/dL (ref 12.0–15.0)
Immature Granulocytes: 0 %
Lymphocytes Relative: 27 %
Lymphs Abs: 2.7 10*3/uL (ref 0.7–4.0)
MCH: 29.9 pg (ref 26.0–34.0)
MCHC: 32.9 g/dL (ref 30.0–36.0)
MCV: 90.9 fL (ref 80.0–100.0)
Monocytes Absolute: 0.9 10*3/uL (ref 0.1–1.0)
Monocytes Relative: 9 %
Neutro Abs: 6.3 10*3/uL (ref 1.7–7.7)
Neutrophils Relative %: 62 %
Platelets: 349 10*3/uL (ref 150–400)
RBC: 4.75 MIL/uL (ref 3.87–5.11)
RDW: 14 % (ref 11.5–15.5)
WBC: 10.1 10*3/uL (ref 4.0–10.5)
nRBC: 0 % (ref 0.0–0.2)

## 2021-08-18 LAB — RESP PANEL BY RT-PCR (FLU A&B, COVID) ARPGX2
Influenza A by PCR: NEGATIVE
Influenza B by PCR: NEGATIVE
SARS Coronavirus 2 by RT PCR: NEGATIVE

## 2021-08-18 LAB — COMPREHENSIVE METABOLIC PANEL
ALT: 32 U/L (ref 0–44)
AST: 26 U/L (ref 15–41)
Albumin: 3.8 g/dL (ref 3.5–5.0)
Alkaline Phosphatase: 91 U/L (ref 38–126)
Anion gap: 9 (ref 5–15)
BUN: 12 mg/dL (ref 6–20)
CO2: 21 mmol/L — ABNORMAL LOW (ref 22–32)
Calcium: 9.2 mg/dL (ref 8.9–10.3)
Chloride: 101 mmol/L (ref 98–111)
Creatinine, Ser: 0.56 mg/dL (ref 0.44–1.00)
GFR, Estimated: >60 mL/min (ref >60)
Glucose, Bld: 83 mg/dL (ref 70–99)
Potassium: 4 mmol/L (ref 3.5–5.1)
Sodium: 131 mmol/L — ABNORMAL LOW (ref 135–145)
Total Bilirubin: 0.3 mg/dL (ref 0.3–1.2)
Total Protein: 7.1 g/dL (ref 6.5–8.1)

## 2021-08-18 LAB — PROTIME-INR
INR: 1 (ref 0.8–1.2)
Prothrombin Time: 12.9 seconds (ref 11.4–15.2)

## 2021-08-18 MED ORDER — ACETAMINOPHEN 650 MG RE SUPP: 650.0000 mg | Freq: Four times a day (QID) | RECTAL | Status: DC | PRN

## 2021-08-18 MED ORDER — NEBIVOLOL HCL 5 MG PO TABS: 5.0000 mg | ORAL_TABLET | Freq: Every day | ORAL | Status: DC

## 2021-08-18 MED ORDER — HEPARIN (PORCINE) 25000 UT/250ML-% IV SOLN
1150.0000 [IU]/h | INTRAVENOUS | Status: DC
  Administered 2021-08-19: 1150 [IU]/h via INTRAVENOUS
  Filled 2021-08-18: qty 250

## 2021-08-18 MED ORDER — ONDANSETRON HCL 4 MG/2ML IJ SOLN: 4.0000 mg | Freq: Four times a day (QID) | INTRAMUSCULAR | Status: DC | PRN

## 2021-08-18 MED ORDER — ACETAMINOPHEN 325 MG PO TABS: 650.0000 mg | ORAL_TABLET | Freq: Four times a day (QID) | ORAL | Status: DC | PRN

## 2021-08-18 MED ORDER — DIAZEPAM 5 MG PO TABS: 5.0000 mg | ORAL_TABLET | Freq: Every day | ORAL | Status: DC | PRN

## 2021-08-18 MED ORDER — ASPIRIN EC 81 MG PO TBEC
81.0000 mg | DELAYED_RELEASE_TABLET | Freq: Every day | ORAL | Status: DC
  Administered 2021-08-19: 81 mg via ORAL
  Filled 2021-08-18: qty 1

## 2021-08-18 MED ORDER — LORAZEPAM 1 MG PO TABS
1.0000 mg | ORAL_TABLET | Freq: Once | ORAL | Status: AC | PRN
  Administered 2021-08-18: 1 mg via ORAL
  Filled 2021-08-18: qty 1

## 2021-08-18 MED ORDER — ROSUVASTATIN CALCIUM 20 MG PO TABS
20.0000 mg | ORAL_TABLET | Freq: Every day | ORAL | Status: DC
  Administered 2021-08-19: 20 mg via ORAL
  Filled 2021-08-18: qty 1

## 2021-08-18 MED ORDER — ONDANSETRON HCL 4 MG PO TABS: 4.0000 mg | ORAL_TABLET | Freq: Four times a day (QID) | ORAL | Status: DC | PRN

## 2021-08-18 MED ORDER — SPIRONOLACTONE 25 MG PO TABS: 25.0000 mg | ORAL_TABLET | Freq: Every day | ORAL | Status: DC

## 2021-08-18 NOTE — Consult Note (Signed)
ASSESSMENT & PLAN   SYMPTOMATIC RIGHT CAROTID STENOSIS: This patient has a very tight right carotid stenosis.  I think that this is likely responsible for her episodes of transient loss of vision in the right eye which she has had over the last 2 weeks.  She is also had problems with dizziness.  She had a myocardial infarction in July.  Her blood pressure is very poorly controlled.  She was just started on aspirin and a statin.  I would recommend addressing the right carotid stenosis once her blood pressure is under better control and she is cleared from a cardiac standpoint.  Given the severity of the stenosis I would recommend IV heparin.  I would also like to obtain a CT angiogram of the neck to be sure that the stenosis is surgically accessible.  The alternative option would be trans carotid stenting.   REASON FOR CONSULT:    Right carotid stenosis.  The consult is requested by Dr. Delford Field  HPI:   Samantha Russo is a 60 y.o. female who had an outpatient carotid duplex scan which showed a very tight right carotid stenosis.  The patient was apparently told to come to the emergency department.  On my history, approximately 2 weeks ago the patient had a brief episode of transient loss of vision of the right eye.  She had a second episode about a week ago.  She is also had some persistent dizziness since she had a heart attack in July.  She tells me she had a myocardial infarction on July 8.  She denies any previous history of stroke, TIAs, expressive or receptive aphasia, or amaurosis fugax.  She was just started on aspirin and just started on a statin.  Her risk factors for peripheral vascular disease include hypertension, hypercholesterolemia, and tobacco use.  She smokes 3 to 5 cigarettes a day.  She denies any history of diabetes.  She denies any family history of premature cardiovascular disease.  I do not get any clear-cut history of claudication.  She does have some numbness from her  right knee to her foot which she attributes to 3 previous back operations.  Past Medical History:  Diagnosis Date   Atypical chest pain 07/25/2021   Cervical disc disorder with radiculopathy 08/09/2015   Chronic neck and back pain    Hypertension    Smoking 07/25/2021    Family History  Problem Relation Age of Onset   Heart failure Father    Diabetes Father     SOCIAL HISTORY: Social History   Tobacco Use   Smoking status: Every Day    Packs/day: 0.10    Types: Cigarettes   Smokeless tobacco: Never  Substance Use Topics   Alcohol use: Yes    No Known Allergies  No current facility-administered medications for this encounter.   Current Outpatient Medications  Medication Sig Dispense Refill   aspirin EC 81 MG tablet Take 1 tablet (81 mg total) by mouth daily. Swallow whole. 90 tablet 3   diazepam (VALIUM) 10 MG tablet Take 5 mg by mouth daily as needed for anxiety.     nebivolol (BYSTOLIC) 5 MG tablet Take 1 tablet (5 mg total) by mouth daily. 90 tablet 1   nitroGLYCERIN (NITROLINGUAL) 0.4 MG/SPRAY spray Place 1 spray under the tongue every 5 (five) minutes x 3 doses as needed for chest pain (Do not exceed 3 times in a day and Call 911 if chest pain continues or worsening). 12 g 12   rosuvastatin (  CRESTOR) 20 MG tablet Take 1 tablet (20 mg total) by mouth daily. 90 tablet 3   spironolactone (ALDACTONE) 25 MG tablet Take 1 tablet (25 mg total) by mouth daily. 90 tablet 1   Vitamin D, Ergocalciferol, (DRISDOL) 1.25 MG (50000 UNIT) CAPS capsule Take 50,000 Units by mouth every 3 (three) days.      REVIEW OF SYSTEMS:  [X]  denotes positive finding, [ ]  denotes negative finding Cardiac  Comments:  Chest pain or chest pressure:    Shortness of breath upon exertion:    Short of breath when lying flat:    Irregular heart rhythm:        Vascular    Pain in calf, thigh, or hip brought on by ambulation:    Pain in feet at night that wakes you up from your sleep:     Blood clot in  your veins:    Leg swelling:         Pulmonary    Oxygen at home:    Productive cough:     Wheezing:         Neurologic    Sudden weakness in arms or legs:     Sudden numbness in arms or legs:     Sudden onset of difficulty speaking or slurred speech:    Temporary loss of vision in one eye:  x   Problems with dizziness:  x       Gastrointestinal    Blood in stool:     Vomited blood:         Genitourinary    Burning when urinating:     Blood in urine:        Psychiatric    Major depression:         Hematologic    Bleeding problems:    Problems with blood clotting too easily:        Skin    Rashes or ulcers:        Constitutional    Fever or chills:    -  PHYSICAL EXAM:   Vitals:   08/18/21 2015 08/18/21 2030 08/18/21 2045 08/18/21 2100  BP: 135/87 (!) 144/92 (!) 140/104 (!) 143/105  Pulse: 90 87 88 92  Resp: 15 18 16 15   Temp:      TempSrc:      SpO2: 97% 97% 98% 97%  Weight:      Height:       Body mass index is 32.8 kg/m. GENERAL: The patient is a well-nourished female, in no acute distress. The vital signs are documented above. CARDIAC: There is a regular rate and rhythm.  VASCULAR: I do not detect carotid bruits. On the right side she has a palpable femoral, dorsalis pedis, posterior tibial pulse. On the left side she has a palpable femoral and posterior tibial pulse. PULMONARY: There is good air exchange bilaterally without wheezing or rales. ABDOMEN: Soft and non-tender with normal pitched bowel sounds.  I do not palpate any aneurysm. MUSCULOSKELETAL: There are no major deformities. NEUROLOGIC: No focal weakness or paresthesias are detected. SKIN: There are no ulcers or rashes noted. PSYCHIATRIC: The patient has a normal affect.  DATA:    CAROTID DUPLEX: I reviewed her carotid duplex scan that was done as an outpatient.  This showed a greater than 80% right carotid stenosis.  The right vertebral artery was patent with antegrade flow.  On the left  side she had a 40 to 59% stenosis.  The left vertebral artery was patent  with antegrade flow.  LABS: Her GFR is greater than 60.  Creatinine 0.56.  Hemoglobin 14.2.  Platelets 349,000.   Waver[facility]

## 2021-08-18 NOTE — ED Provider Notes (Signed)
[facility] EMERGENCY DEPARTMENT Provider Note   CSN: [id number] Arrival date & time: 08/18/21  1716     History Chief Complaint  Patient presents with   Carotid blockage    Samantha Russo is a 60 y.o. female.  Samantha Russo states that she has felt dizzy and nauseated for over 1 month.  On July 8, after doing quite a bit of housework in preparation for having houseguests, she awoke in the middle the night with severe back pain and diaphoresis.  She later presented to cardiology and was told that she had a complete blockage of her right coronary artery.  Med management was recommended due to extensive collaterals.  When she relayed the fact that her dizziness was persistent, cardiology ordered carotid ultrasound, and she was sent to the emergency department today after the ultrasound revealed a near right carotid artery blockage.  Clarification of visual symptoms later in her ED course.  She states that she has had 2 discrete episodes of total visual loss may be in her right eye but may be both.  These have been very brief in duration, lasting seconds at the most.  The history is provided by the patient.  Dizziness Quality:  Imbalance Severity:  Moderate Onset quality:  Gradual Duration:  1 month Timing:  Intermittent Progression:  Waxing and waning Chronicity:  New Context comment:  No known cause Relieved by:  Nothing Worsened by:  Nothing Ineffective treatments:  None tried Associated symptoms: nausea   Associated symptoms: no chest pain, no palpitations, no shortness of breath and no vomiting  Visual change: she told me no, but cardiology note says intermittent vision loss.     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     Family History  Problem Relation Age of Onset   Heart failure Father    Diabetes Father     Social History   Tobacco Use   Smoking status: Every Day    Packs/day: 0.10    Types: Cigarettes   Smokeless tobacco: Never  Vaping Use   Vaping Use: Never used  Substance Use Topics   Alcohol use: Yes   Drug use: Never    Home Medications Prior to Admission medications   Medication Sig Start Date End Date Taking? Authorizing Provider  aspirin EC 81 MG tablet Take 1 tablet (81 mg total) by mouth daily. Swallow whole. 07/25/21   Georgeanna Lea, MD  nebivolol (BYSTOLIC) 5 MG tablet Take 1 tablet (5 mg total) by mouth daily. 07/25/21   Georgeanna Lea, MD  nicotine (NICODERM CQ - DOSED IN MG/24 HOURS) 14 mg/24hr patch Place 1 patch onto  the skin daily. 08/08/21   [provider]  nitroGLYCERIN (NITROLINGUAL) 0.4 MG/SPRAY spray Place 1 spray under the tongue every 5 (five) minutes x 3 doses as needed for chest pain (Do not exceed 3 times in a day and Call 911 if chest pain continues or worsening). 07/25/21   Georgeanna Lea, MD  rosuvastatin (CRESTOR) 20 MG tablet Take 1 tablet (20 mg total) by mouth daily. 07/27/21 10/25/21  Georgeanna Lea, MD  spironolactone (ALDACTONE) 25 MG tablet Take 1 tablet (25 mg total) by mouth daily. 07/25/21   Georgeanna Lea, MD  Vitamin D, Ergocalciferol, (DRISDOL) 1.25 MG  (50000 UNIT) CAPS capsule Take 50,000 Units by mouth every 3 (three) days.    [provider]    Allergies    Patient has no known allergies.  Review of Systems   Review of Systems  Constitutional:  Negative for chills and fever.  HENT:  Negative for ear pain and sore throat.   Eyes:  Negative for pain and visual disturbance.  Respiratory:  Negative for cough and shortness of breath.   Cardiovascular:  Negative for chest pain and palpitations.  Gastrointestinal:  Positive for nausea. Negative for abdominal pain and vomiting.  Genitourinary:  Negative for dysuria and hematuria.  Musculoskeletal:  Negative for arthralgias and back pain.  Skin:  Negative for color change and rash.  Neurological:  Positive for dizziness. Negative for seizures and syncope.  All other systems reviewed and are negative.  Physical Exam Updated Vital Signs BP (!) 171/108 (BP Location: Right Arm)   Pulse (!) 126   Temp 100 F (37.8 C) (Oral)   Resp 18   Ht 5\' 3"  (1.6 m)   Wt 84 kg   LMP  (LMP Unknown)   SpO2 99%   BMI 32.80 kg/m   Physical Exam Vitals and nursing note reviewed.  Constitutional:      Appearance: She is well-developed.  HENT:     Head: Normocephalic and atraumatic.  Neck:     Vascular: No carotid bruit.  Cardiovascular:     Rate and Rhythm: Regular rhythm. Tachycardia present.     Heart sounds: Normal heart sounds.  Pulmonary:     Effort: Pulmonary effort is normal. No tachypnea.     Breath sounds: Normal breath sounds.  Musculoskeletal:     Right lower leg: No edema.     Left lower leg: No edema.  Skin:    General: Skin is warm and dry.  Neurological:     General: No focal deficit present.     Mental Status: She is alert and oriented to person, place, and time.     Cranial Nerves: No cranial nerve deficit.     Sensory: No sensory deficit.     Motor: No weakness.     Coordination: Coordination normal.  Psychiatric:        Mood and Affect: Mood normal.         Behavior: Behavior normal.    ED Results / Procedures / Treatments   Labs (all labs ordered are listed, but only abnormal results are displayed) Labs Reviewed  RESP PANEL BY RT-PCR (FLU A&B, COVID) ARPGX2  CBC WITH DIFFERENTIAL/PLATELET  COMPREHENSIVE METABOLIC PANEL  PROTIME-INR  URINALYSIS, ROUTINE W REFLEX MICROSCOPIC    EKG None  Radiology VAS US CAROTID  Result Date: 08/18/2021 Carotid Arterial Duplex Study Patient Name:  Samantha Russo  Date of Exam:   08/18/2021 Medical Rec #: [id number]  Accession #:    [id number] Date of Birth: 12-Apr-1961          Patient Gender: F Patient Age:   89 years Exam Location:  [facility] Procedure:      VAS US CAROTID Referring Phys: Gypsy BalsamOBERT KRASOWSKI --------------------------------------------------------------------------------  Indications:                            Visual disturbance and Patient has had                                         constant dizziness since 07/07/2021.                                         Patient has had 2 episodes of complete                                         blindness and lasted a few seconds.                                         Patient denies any other                                         cerebrovascular symptoms. Risk Factors:                           Hypertension, hyperlipidemia, current                                         smoker, coronary artery disease. Comparison Study:                       None Pre-Surgical Evaluation & Surgical      High resistant low flow seen in the Correlation                             proximal CCA; could there be a more                                         proximal stenosis? ICA is normal past                                         the stenosis. Anatomy on the right is                                         within normal limits.Right bifurcation  is located near the Hyoid Notch. Performing Technologist: Carlos American RVT,  RDCS (AE), RDMS  Examination Guidelines: A complete evaluation includes B-mode imaging, spectral Doppler, color Doppler, and power Doppler as needed of all accessible portions of each vessel. Bilateral testing is considered an integral part of a complete examination. Limited examinations for reoccurring indications may be performed as noted.  Right Carotid Findings: +----------+--------+--------+--------+---------------------+------------------+           PSV cm/sEDV cm/sStenosisPlaque Description   Comments           +----------+--------+--------+--------+---------------------+------------------+ CCA Prox  27      6                                    high resistant                                                            flow               +----------+--------+--------+--------+---------------------+------------------+ CCA Mid   17      7                                                       +----------+--------+--------+--------+---------------------+------------------+ CCA Distal25      12                                   intimal thickening +----------+--------+--------+--------+---------------------+------------------+ ICA Prox  683     358     80-99%  heterogenous and                                                          irregular                               +----------+--------+--------+--------+---------------------+------------------+ ICA Mid   29      14                                                      +----------+--------+--------+--------+---------------------+------------------+ ICA Distal45      22                                                      +----------+--------+--------+--------+---------------------+------------------+ ECA       203     52      >50%                                            +----------+--------+--------+--------+---------------------+------------------+  +----------+--------+-------+---------+-------------------+  PSV cm/sEDV cmsDescribe Arm Pressure (mmHG) +----------+--------+-------+---------+-------------------+ [id number]            [id number]                 +----------+--------+-------+---------+-------------------+ +---------+--------+--+--------+--+---------+ VertebralPSV cm/s70EDV cm/s31Antegrade +---------+--------+--+--------+--+---------+  Left Carotid Findings: +----------+--------+--------+--------+--------------------+-------------------+           PSV cm/sEDV cm/sStenosisPlaque Description  Comments            +----------+--------+--------+--------+--------------------+-------------------+ CCA Prox  124     42                                                      +----------+--------+--------+--------+--------------------+-------------------+ CCA Mid   81      35              homogeneous and                                                           smooth                                  +----------+--------+--------+--------+--------------------+-------------------+ CCA Distal105     46      <50%    homogeneous and     Hypoechoic plaque                                     smooth              measuring .36cm AP  +----------+--------+--------+--------+--------------------+-------------------+ ICA Prox  119     45      40-59%                                          +----------+--------+--------+--------+--------------------+-------------------+ ICA Mid   129     48      40-59%                      tortuous            +----------+--------+--------+--------+--------------------+-------------------+ ICA Distal64      24                                                      +----------+--------+--------+--------+--------------------+-------------------+ ECA       267     82      >50%                                             +----------+--------+--------+--------+--------------------+-------------------+ +----------+--------+--------+----------------+-------------------+           PSV cm/sEDV cm/sDescribe        Arm Pressure (mmHG) +----------+--------+--------+----------------+-------------------+ [id number]  Multiphasic, [id number]                 +----------+--------+--------+----------------+-------------------+ +---------+--------+--+--------+--+---------+ VertebralPSV cm/s89EDV cm/s35Antegrade +---------+--------+--+--------+--+---------+    Findings reported to Dr. Flora Lipps (DOD) at 4:00 pm. Patient sent to ER with husband. . Summary: Right Carotid: Velocities in the right ICA are consistent with a 80-99%                stenosis.                 Low flow in the CCA with a question of a more proximal stenosis. Left Carotid: Velocities in the left ICA are consistent with a 40-59% stenosis.               Non-hemodynamically significant plaque <50% noted in the CCA. Vertebrals:  Bilateral vertebral arteries demonstrate antegrade flow. Subclavians: Right subclavian artery flow was disturbed. Normal flow              hemodynamics were seen in the left subclavian artery. *See table(s) above for measurements and observations.  Vascular consult recommended. Electronically signed by Julien Nordmann MD on 08/18/2021 at 4:30:55 PM.    Final     Procedures .Critical Care  Date/Time: 08/18/2021 11:36 PM Performed by: Koleen Distance, MD Authorized by: Koleen Distance, MD   Critical care provider statement:    Critical care time (minutes):  30   Critical care time was exclusive of:  Teaching time and separately billable procedures and treating other patients   Critical care was necessary to treat or prevent imminent or life-threatening deterioration of the following conditions:  CNS failure or compromise   Critical care was time spent personally by me on the following activities:  Blood draw for specimens,  development of treatment plan with patient or surrogate, discussions with consultants, evaluation of patient's response to treatment, examination of patient, obtaining history from patient or surrogate, ordering and performing treatments and interventions, ordering and review of laboratory studies, ordering and review of radiographic studies, re-evaluation of patient's condition and review of old charts   I assumed direction of critical care for this patient from another provider in my specialty: no     Care discussed with: admitting provider     Medications Ordered in ED Medications  acetaminophen (TYLENOL) tablet 650 mg (has no administration in time range)    Or  acetaminophen (TYLENOL) suppository 650 mg (has no administration in time range)  ondansetron (ZOFRAN) tablet 4 mg (has no administration in time range)    Or  ondansetron (ZOFRAN) injection 4 mg (has no administration in time range)  rosuvastatin (CRESTOR) tablet 20 mg (has no administration in time range)  aspirin EC tablet 81 mg (has no administration in time range)  diazepam (VALIUM) tablet 5 mg (has no administration in time range)  heparin ADULT infusion 100 units/mL (25000 units/262mL) (has no administration in time range)  LORazepam (ATIVAN) tablet 1 mg (1 mg Oral Given 08/18/21 2236)    ED Course  I have reviewed the triage vital signs and the nursing notes.  Pertinent labs & imaging results that were available during my care of the patient were reviewed by me and considered in my medical decision making (see chart for details).  Clinical Course as of 08/18/21 2332  Fri Aug 18, 2021  4492 I spoke with Dr. Durwin Nora. He will consult on the patient after he finishes in the OR.  [AW]  2147 Dr. Durwin Nora recommends medicine admit for BP control, heparin. Will  order CTA. [AW]  2332 I spoke with Dr. Julian Reil who will admit. Requests MRI. [AW]    Clinical Course User Index [AW] Koleen Distance, MD   MDM Rules/Calculators/A&P                            Samantha Russo presented upon referral from cardiology after carotid ultrasound revealed critical right carotid artery stenosis.  She had no neurologic deficits but had been experiencing severe dizziness and has had intermittent visual loss.  Vascular surgery was consulted and recommended admission.  Medicine was also consulted, and they evaluated her case.  Patient will have further neuroimaging.  Permissive hypertension was deemed to be the appropriate route for blood pressure control given the severe carotid stenosis. Final Clinical Impression(s) / ED Diagnoses Final diagnoses:  Stenosis of right carotid artery  Amaurosis fugax  Coronary artery disease involving native coronary artery of native heart without angina pectoris  Tobacco use  Primary hypertension    Rx / DC Orders ED Discharge Orders     None        Koleen Distance, MD 08/18/21 2336

## 2021-08-18 NOTE — Progress Notes (Signed)
ANTICOAGULATION CONSULT NOTE - Initial Consult  Pharmacy Consult for heparin Indication:  Severe R carotid stenosis  No Known Allergies  Patient Measurements: Height: 5\' 3"  (160 cm) Weight: 84 kg (185 lb 3 oz) IBW/kg (Calculated) : 52.4  Vital Signs: Temp: 100 F (37.8 C) (08/19 1735) Temp Source: Oral (08/19 1735) BP: 143/105 (08/19 2100) Pulse Rate: 92 (08/19 2100)  Labs: Recent Labs    08/18/21 1748  HGB 14.2  HCT 43.2  PLT 349  LABPROT 12.9  INR 1.0  CREATININE 0.56    Estimated Creatinine Clearance: 77.7 mL/min (by C-G formula based on SCr of 0.56 mg/dL).   Medical History: Past Medical History:  Diagnosis Date   Atypical chest pain 07/25/2021   Cervical disc disorder with radiculopathy 08/09/2015   Chronic neck and back pain    Hypertension    Smoking 07/25/2021    Medications:  (Not in a hospital admission)   Assessment: 89 YOF who presents with severe R carotid stenosis and associated R eye vision loss per MD. To start IV heparin per vascular recommendations. Planning to obtain CTA to determine procedural options.   H/H and Plt wnl   Goal of Therapy:  Heparin level 0.3-0.5 units/ml Monitor platelets by anticoagulation protocol: Yes   Plan:  -Start heparin infusion at 1150 units/hr. No bolus  -F/u 6 hr HL -Monitor daily HL, CBC and s/s of bleeding   46, PharmD., BCPS, BCCCP Clinical Pharmacist Please refer to [facility] for unit-specific pharmacist

## 2021-08-18 NOTE — Progress Notes (Unsigned)
Patient came to [facility] office for a bilateral carotid duplex exam this afternoon.    She has a 80-99% stenosis in the right proximal ICA and 40-59 % stenosis of the left proximal/mid ICA.  Low flow seen in the right proximal CCA with a question of a more proximal stenosis.    Dr. Flora Lipps (DOD) talked to patient and sent patient to the ER at [facility].  Carlos American, RVT, RDCS, RDMS [facility] at [facility] office

## 2021-08-18 NOTE — ED Triage Notes (Signed)
Pt here from Dr office for blockage in R Carotid artery. Pt had hx of new cardiac event in July, was following a cardiologist but reported dizziness. Pt anxious in triage. Denies pain/shob

## 2021-08-18 NOTE — ED Notes (Signed)
Received a call from MRI, pt refusing scan. Dr. Julian Reil notified.

## 2021-08-18 NOTE — Telephone Encounter (Signed)
   Pt's spouse calling, he said pt went to get carotid test today and they saw 2 blockages and they were told by vas team to go to the ED, he wanted to speak with a nurse to discuss

## 2021-08-18 NOTE — H&P (Signed)
History and Physical    Samantha Russo:[id number] DOB: 1961-07-28 DOA: 08/18/2021  PCP: Pcp, No  Patient coming from: Home  I have personally briefly reviewed patient's old medical records in [facility]  Chief Complaint: Amaurosis Fugax  HPI: Samantha Russo is a 60 y.o. female with medical history significant of HTN, smoking.  Pt had MI last month, anterior wall apparently per cards notes.  No LHC done but had coronary CT done which showed chronic looking occlusion of RCA (with collaterals) (not sure what the LAD and circumflex look like).  Over past couple of weeks has had 2 episodes of loss of vision in R eye.  "Curtain coming down over vision".  Symptoms resolved each time.  Went to see cards in office today, told them about the symptoms.  They promptly got a carotid US which showed 80-99% R ICA stenosis.  (L ICA = 40-59%).  Pt sent into ED for amaurosis fugax.   ED Course: Dr. Edilia Bo has seen pt, note in chart.  Pt needs intervention of some form (CEA vs stent).   Review of Systems: As per HPI, otherwise all review of systems negative.  Past Medical History:  Diagnosis Date   Atypical chest pain 07/25/2021   Cervical disc disorder with radiculopathy 08/09/2015   Chronic neck and back pain    Hypertension    Smoking 07/25/2021    Past Surgical History:  Procedure Laterality Date   PLACEMENT OF BREAST IMPLANTS     SPINAL FUSION     2012 2013     reports that she has been smoking cigarettes. She has been smoking an average of .1 packs per day. She has never used smokeless tobacco. She reports current alcohol use. She reports that she does not use drugs.  No Known Allergies  Family History  Problem Relation Age of Onset   Heart failure Father    Diabetes Father      Prior to Admission medications   Medication Sig Start Date End Date Taking? Authorizing Provider  aspirin EC 81 MG tablet Take 1 tablet (81 mg total) by mouth daily. Swallow  whole. 07/25/21  Yes Georgeanna Lea, MD  diazepam (VALIUM) 10 MG tablet Take 5 mg by mouth daily as needed for anxiety.   Yes [provider]  nebivolol (BYSTOLIC) 5 MG tablet Take 1 tablet (5 mg total) by mouth daily. 07/25/21  Yes Georgeanna Lea, MD  nitroGLYCERIN (NITROLINGUAL) 0.4 MG/SPRAY spray Place 1 spray under the tongue every 5 (five) minutes x 3 doses as needed for chest pain (Do not exceed 3 times in a day and Call 911 if chest pain continues or worsening). 07/25/21  Yes Georgeanna Lea, MD  rosuvastatin (CRESTOR) 20 MG tablet Take 1 tablet (20 mg total) by mouth daily. 07/27/21 10/25/21 Yes Georgeanna Lea, MD  spironolactone (ALDACTONE) 25 MG tablet Take 1 tablet (25 mg total) by mouth daily. 07/25/21  Yes Georgeanna Lea, MD  Vitamin D, Ergocalciferol, (DRISDOL) 1.25 MG (50000 UNIT) CAPS capsule Take 50,000 Units by mouth every 3 (three) days.   Yes [provider]    Physical Exam: Vitals:   08/18/21 2015 08/18/21 2030 08/18/21 2045 08/18/21 2100  BP: 135/87 (!) 144/92 (!) 140/104 (!) 143/105  Pulse: 90 87 88 92  Resp: 15 18 16 15   Temp:      TempSrc:      SpO2: 97% 97% 98% 97%  Weight:      Height:  Constitutional: NAD, Anxious Eyes: PERRL, lids and conjunctivae normal ENMT: Mucous membranes are moist. Posterior pharynx clear of any exudate or lesions.Normal dentition.  Neck: normal, supple, no masses, no thyromegaly Respiratory: clear to auscultation bilaterally, no wheezing, no crackles. Normal respiratory effort. No accessory muscle use.  Cardiovascular: Regular rate and rhythm, no murmurs / rubs / gallops. No extremity edema. 2+ pedal pulses. No carotid bruits.  Abdomen: no tenderness, no masses palpated. No hepatosplenomegaly. Bowel sounds positive.  Musculoskeletal: no clubbing / cyanosis. No joint deformity upper and lower extremities. Good ROM, no contractures. Normal muscle tone.  Skin: no rashes, lesions, ulcers. No  induration Neurologic: CN 2-12 grossly intact. Sensation intact, DTR normal. Strength 5/5 in all 4.  Psychiatric: Normal judgment and insight. Alert and oriented x 3. Anxious mood.    Labs on Admission: I have personally reviewed following labs and imaging studies  CBC: Recent Labs  Lab 08/18/21 1748  WBC 10.1  NEUTROABS 6.3  HGB 14.2  HCT 43.2  MCV 90.9  PLT 349   Basic Metabolic Panel: Recent Labs  Lab 08/18/21 1748  NA 131*  K 4.0  CL 101  CO2 21*  GLUCOSE 83  BUN 12  CREATININE 0.56  CALCIUM 9.2   GFR: Estimated Creatinine Clearance: 77.7 mL/min (by C-G formula based on SCr of 0.56 mg/dL). Liver Function Tests: Recent Labs  Lab 08/18/21 1748  AST 26  ALT 32  ALKPHOS 91  BILITOT 0.3  PROT 7.1  ALBUMIN 3.8   No results for input(s): LIPASE, AMYLASE in the last 168 hours. No results for input(s): AMMONIA in the last 168 hours. Coagulation Profile: Recent Labs  Lab 08/18/21 1748  INR 1.0   Cardiac Enzymes: No results for input(s): CKTOTAL, CKMB, CKMBINDEX, TROPONINI in the last 168 hours. BNP (last 3 results) No results for input(s): PROBNP in the last 8760 hours. HbA1C: No results for input(s): HGBA1C in the last 72 hours. CBG: No results for input(s): GLUCAP in the last 168 hours. Lipid Profile: No results for input(s): CHOL, HDL, LDLCALC, TRIG, CHOLHDL, LDLDIRECT in the last 72 hours. Thyroid Function Tests: No results for input(s): TSH, T4TOTAL, FREET4, T3FREE, THYROIDAB in the last 72 hours. Anemia Panel: No results for input(s): VITAMINB12, FOLATE, FERRITIN, TIBC, IRON, RETICCTPCT in the last 72 hours. Urine analysis: No results found for: COLORURINE, APPEARANCEUR, LABSPEC, PHURINE, GLUCOSEU, HGBUR, BILIRUBINUR, KETONESUR, PROTEINUR, UROBILINOGEN, NITRITE, LEUKOCYTESUR  Radiological Exams on Admission: VAS US CAROTID  Result Date: 08/18/2021 Carotid Arterial Duplex Study Patient Name:  Samantha Russo  Date of Exam:   08/18/2021 Medical  Rec #: [id number]           Accession #:    [id number] Date of Birth: March 13, 1961          Patient Gender: F Patient Age:   17 years Exam Location:  [facility] Procedure:      VAS US CAROTID Referring Phys: Gypsy Balsam --------------------------------------------------------------------------------  Indications:                            Visual disturbance and Patient has had                                         constant dizziness since 07/07/2021.  Patient has had 2 episodes of complete                                         blindness and lasted a few seconds.                                         Patient denies any other                                         cerebrovascular symptoms. Risk Factors:                           Hypertension, hyperlipidemia, current                                         smoker, coronary artery disease. Comparison Study:                       None Pre-Surgical Evaluation & Surgical      High resistant low flow seen in the Correlation                             proximal CCA; could there be a more                                         proximal stenosis? ICA is normal past                                         the stenosis. Anatomy on the right is                                         within normal limits.Right bifurcation                                         is located near the [place]. Performing Technologist: Carlos American RVT, RDCS (AE), RDMS  Examination Guidelines: A complete evaluation includes B-mode imaging, spectral Doppler, color Doppler, and power Doppler as needed of all accessible portions of each vessel. Bilateral testing is considered an integral part of a complete examination. Limited examinations for reoccurring indications may be performed as noted.  Right Carotid Findings: +----------+--------+--------+--------+---------------------+------------------+           PSV cm/sEDV cm/sStenosisPlaque  Description   Comments           +----------+--------+--------+--------+---------------------+------------------+ CCA Prox  27      6  high resistant                                                            flow               +----------+--------+--------+--------+---------------------+------------------+ CCA Mid   17      7                                                       +----------+--------+--------+--------+---------------------+------------------+ CCA Distal25      12                                   intimal thickening +----------+--------+--------+--------+---------------------+------------------+ ICA Prox  683     358     80-99%  heterogenous and                                                          irregular                               +----------+--------+--------+--------+---------------------+------------------+ ICA Mid   29      14                                                      +----------+--------+--------+--------+---------------------+------------------+ ICA Distal45      22                                                      +----------+--------+--------+--------+---------------------+------------------+ ECA       203     52      >50%                                            +----------+--------+--------+--------+---------------------+------------------+ +----------+--------+-------+---------+-------------------+           PSV cm/sEDV cmsDescribe Arm Pressure (mmHG) +----------+--------+-------+---------+-------------------+ [id number]            [id number]                 +----------+--------+-------+---------+-------------------+ +---------+--------+--+--------+--+---------+ VertebralPSV cm/s70EDV cm/s31Antegrade +---------+--------+--+--------+--+---------+  Left Carotid Findings:  +----------+--------+--------+--------+--------------------+-------------------+           PSV cm/sEDV cm/sStenosisPlaque Description  Comments            +----------+--------+--------+--------+--------------------+-------------------+ CCA Prox  124     42                                                      +----------+--------+--------+--------+--------------------+-------------------+  CCA Mid   81      35              homogeneous and                                                           smooth                                  +----------+--------+--------+--------+--------------------+-------------------+ CCA Distal105     46      <50%    homogeneous and     Hypoechoic plaque                                     smooth              measuring .36cm AP  +----------+--------+--------+--------+--------------------+-------------------+ ICA Prox  119     45      40-59%                                          +----------+--------+--------+--------+--------------------+-------------------+ ICA Mid   129     48      40-59%                      tortuous            +----------+--------+--------+--------+--------------------+-------------------+ ICA Distal64      24                                                      +----------+--------+--------+--------+--------------------+-------------------+ ECA       267     82      >50%                                            +----------+--------+--------+--------+--------------------+-------------------+ +----------+--------+--------+----------------+-------------------+           PSV cm/sEDV cm/sDescribe        Arm Pressure (mmHG) +----------+--------+--------+----------------+-------------------+ [id number]             Multiphasic, [id number]                 +----------+--------+--------+----------------+-------------------+ +---------+--------+--+--------+--+---------+ VertebralPSV  cm/s89EDV cm/s35Antegrade +---------+--------+--+--------+--+---------+    Findings reported to Dr. Flora Lipps (DOD) at 4:00 pm. Patient sent to ER with husband. . Summary: Right Carotid: Velocities in the right ICA are consistent with a 80-99%                stenosis.                 Low flow in the CCA with a question of a more proximal stenosis. Left Carotid: Velocities in the left ICA are consistent with a 40-59% stenosis.               Non-hemodynamically significant plaque <  50% noted in the CCA. Vertebrals:  Bilateral vertebral arteries demonstrate antegrade flow. Subclavians: Right subclavian artery flow was disturbed. Normal flow              hemodynamics were seen in the left subclavian artery. *See table(s) above for measurements and observations.  Vascular consult recommended. Electronically signed by Julien Nordmannimothy Gollan MD on 08/18/2021 at 4:30:55 PM.    Final     EKG: Independently reviewed.  Assessment/Plan Principal Problem:   Amaurosis fugax of right eye Active Problems:   Hypertension   Coronary artery disease completely occluded RCA with nonobstructive disease elsewhere based on coronary CT    Amaurosis fugax of R eye - With critical stenosis of R carotid artery Dr. Edilia Boickson has seen pt: Heparin gtt MRI CTA head and neck Needs CEA vs stent Tele monitor Doesn't sound like hes planning on doing surgery tomorrow so will let pt eat. CAD -  With MI last month Sounds like this was anterior wall Has occluded RCA with collaterals on CT Doesn't sound like she had LHC. Cont Statin and ASA Message sent to P trent for cards eval in AM: re: clearance for vasc surgery HTN - Holding home HTN meds for the moment given episodes of amaurosis fugax, critical stenosis of carotid, MRI pending etc.  DVT prophylaxis: Heparin gtt Code Status: Full Family Communication: No family in room Disposition Plan: Home after admit Consults called: Dr. Edilia Boickson Admission status: Admit to  inpatient  Severity of Illness: The appropriate patient status for this patient is INPATIENT. Inpatient status is judged to be reasonable and necessary in order to provide the required intensity of service to ensure the patient's safety. The patient's presenting symptoms, physical exam findings, and initial radiographic and laboratory data in the context of their chronic comorbidities is felt to place them at high risk for further clinical deterioration. Furthermore, it is not anticipated that the patient will be medically stable for discharge from the hospital within 2 midnights of admission. The following factors support the patient status of inpatient.   IP status due to critical carotid stenosis, needs intervention.   * I certify that at the point of admission it is my clinical judgment that the patient will require inpatient hospital care spanning beyond 2 midnights from the point of admission due to high intensity of service, high risk for further deterioration and high frequency of surveillance required.*   Shriyans Kuenzi M. DO [facility]  How to contact the [facility] Attending or Consulting provider 7A - 7P or covering provider during after hours 7P -7A, for this patient?  Check the care team in [facility] and look for a) attending/consulting [facility] provider listed and b) the [facility] team listed Log into [email or web address]  Amion Physician Scheduling and messaging for groups and whole hospitals  On call and physician scheduling software for group practices, residents, hospitalists and other medical providers for call, clinic, rotation and shift schedules. OnCall Enterprise is a hospital-wide system for scheduling doctors and paging doctors on call. EasyPlot is for scientific plotting and data analysis.  [email or web address]  and use [facility]'s universal password to access. If you do not have the password, please contact the hospital operator.  Locate the [facility] provider you are looking for under [facility] and page to a number that you can be directly reached. If you still have difficulty reaching the provider, please page the [facility] (Director on Call) for the Hospitalists listed on amion for assistance.  08/18/2021, 10:51 PM

## 2021-08-18 NOTE — Progress Notes (Signed)
Asked to assess Samantha Russo by Helmut Muster our vascular sonographer.  She informed me that she has a critical 80 to 99% right ICA lesion.  I discussed symptoms with the surgeon Ellie.  She reports dizziness as well as blurry vision in her right eye.  Given the location of her ICA stenosis I believe she is likely having symptomatic carotid artery disease.  Given that it is Friday afternoon and she has significant symptoms concerning for possible TIA I recommended evaluation in the emergency room.  I discussed this with her and her husband.  I suspect she needs a brain MRI and likely vascular surgery consult.  She was sent to the emergency room with her husband.  She was recommended not to drive until evaluated there.  Gerri Spore T. Flora Lipps, MD, [facility]  [facility]  [address], [address] [place], [place] [address] [phone]  4:33 PM

## 2021-08-18 NOTE — ED Provider Notes (Signed)
Emergency Medicine Provider Triage Evaluation Note  Alysha Doolan , a 60 y.o. female  was evaluated in triage.  Pt with history of HTN, dyslipidemia, significantly high LDL, smoking. She has had lightheadedness off and on for the past 1 month.  She states that she has no pain currently just feels very anxious.  She had a ultrasound done of her carotids today and was found to have a severe stenosis   Review of Systems  Positive: Lightheaded Negative: Fever  Physical Exam  LMP  (LMP Unknown)  Gen:   Awake, no distress   Resp:  Normal effort  MSK:   Moves extremities without difficulty  Other:    Medical Decision Making  Medically screening exam initiated at 5:34 PM.  Appropriate orders placed.  Nahjae Hoeg was informed that the remainder of the evaluation will be completed by another provider, this initial triage assessment does not replace that evaluation, and the importance of remaining in the ED until their evaluation is complete.     Gailen Shelter, [place] 08/18/21 1747    Koleen Distance, MD 08/18/21 [phone]

## 2021-08-18 NOTE — Telephone Encounter (Signed)
Spoke to the patient and the patients husband just now and they let me know that Samantha Russo had a carotid ultrasound earlier and were advised by the vascular surgeon to go directly to the emergency room because of a blockage in her right coronary artery. They are heading to [place] hospital now but were wanting me to make Dr. Bing Matter aware.

## 2021-08-18 NOTE — ED Notes (Signed)
Pt extremely anxious about MRI and process in general. Dr. Julian Reil to bedside, spoke at length with pt regarding risks/benefits of MRI and staying in hospital. Pt to MRI.

## 2021-08-19 ENCOUNTER — Inpatient Hospital Stay (HOSPITAL_COMMUNITY): Payer: Managed Care, Other (non HMO)

## 2021-08-19 DIAGNOSIS — G453 Amaurosis fugax: Secondary | ICD-10-CM

## 2021-08-19 LAB — CBC
HCT: 40.3 % (ref 36.0–46.0)
Hemoglobin: 13.5 g/dL (ref 12.0–15.0)
MCH: 30.3 pg (ref 26.0–34.0)
MCHC: 33.5 g/dL (ref 30.0–36.0)
MCV: 90.4 fL (ref 80.0–100.0)
Platelets: 312 10*3/uL (ref 150–400)
RBC: 4.46 MIL/uL (ref 3.87–5.11)
RDW: 14.5 % (ref 11.5–15.5)
WBC: 10.1 10*3/uL (ref 4.0–10.5)
nRBC: 0 % (ref 0.0–0.2)

## 2021-08-19 LAB — URINALYSIS, ROUTINE W REFLEX MICROSCOPIC
Bacteria, UA: NONE SEEN
Bilirubin Urine: NEGATIVE
Glucose, UA: NEGATIVE mg/dL
Ketones, ur: NEGATIVE mg/dL
Leukocytes,Ua: NEGATIVE
Nitrite: NEGATIVE
Protein, ur: NEGATIVE mg/dL
Specific Gravity, Urine: 1.018 (ref 1.005–1.030)
pH: 6 (ref 5.0–8.0)

## 2021-08-19 LAB — BASIC METABOLIC PANEL
Anion gap: 9 (ref 5–15)
BUN: 10 mg/dL (ref 6–20)
CO2: 24 mmol/L (ref 22–32)
Calcium: 9.1 mg/dL (ref 8.9–10.3)
Chloride: 102 mmol/L (ref 98–111)
Creatinine, Ser: 0.57 mg/dL (ref 0.44–1.00)
GFR, Estimated: >60 mL/min (ref >60)
Glucose, Bld: 109 mg/dL — ABNORMAL HIGH (ref 70–99)
Potassium: 3.5 mmol/L (ref 3.5–5.1)
Sodium: 135 mmol/L (ref 135–145)

## 2021-08-19 LAB — HIV ANTIBODY (ROUTINE TESTING W REFLEX): HIV Screen 4th Generation wRfx: NONREACTIVE

## 2021-08-19 LAB — HEPARIN LEVEL (UNFRACTIONATED)
Heparin Unfractionated: <0.1 IU/mL — ABNORMAL LOW (ref 0.30–0.70)
Heparin Unfractionated: 0.22 IU/mL — ABNORMAL LOW (ref 0.30–0.70)

## 2021-08-19 MED ORDER — NEBIVOLOL HCL 5 MG PO TABS
5.0000 mg | ORAL_TABLET | Freq: Every day | ORAL | Status: DC
  Administered 2021-08-19: 5 mg via ORAL
  Filled 2021-08-19: qty 1

## 2021-08-19 MED ORDER — CLOPIDOGREL BISULFATE 75 MG PO TABS
75.0000 mg | ORAL_TABLET | Freq: Every day | ORAL | Status: DC
  Administered 2021-08-19: 75 mg via ORAL
  Filled 2021-08-19: qty 1

## 2021-08-19 MED ORDER — CLOPIDOGREL BISULFATE 75 MG PO TABS: 75.0000 mg | ORAL_TABLET | Freq: Every day | ORAL | 0 refills | Status: DC

## 2021-08-19 MED ORDER — SPIRONOLACTONE 25 MG PO TABS
25.0000 mg | ORAL_TABLET | Freq: Every day | ORAL | Status: DC
  Administered 2021-08-19: 25 mg via ORAL
  Filled 2021-08-19: qty 1

## 2021-08-19 MED ORDER — ALPRAZOLAM 0.25 MG PO TABS
0.5000 mg | ORAL_TABLET | Freq: Once | ORAL | Status: AC
  Administered 2021-08-19: 0.5 mg via ORAL
  Filled 2021-08-19: qty 2

## 2021-08-19 MED ORDER — ROSUVASTATIN CALCIUM 40 MG PO TABS: 40.0000 mg | ORAL_TABLET | Freq: Every day | ORAL | 3 refills | Status: DC

## 2021-08-19 MED ORDER — IOHEXOL 350 MG/ML SOLN
75.0000 mL | Freq: Once | INTRAVENOUS | Status: AC | PRN
  Administered 2021-08-19: 75 mL via INTRAVENOUS

## 2021-08-19 NOTE — ED Notes (Signed)
Spoke with Dr. Lucianne Muss about pt's desire to sign out today. Per Dr. Lucianne Muss he would be down to talk with pt shortly. This RN informed pt that Dr. Lucianne Muss would be by shortly. Will continue to monitor.

## 2021-08-19 NOTE — Progress Notes (Signed)
   VASCULAR SURGERY ASSESSMENT & PLAN:   SYMPTOMATIC RIGHT CAROTID STENOSIS: This patient has had 2 episodes of transient loss of vision in the right eye over the last 2 weeks.  She has a very tight right carotid stenosis.  However, her CT angiogram shows that the right internal carotid artery is occluded at the base of the skull.  There is severe stenosis in the distal cervical right ICA.  Thus there is nothing to offer from a surgical standpoint.  Of note, the duplex suggested a 40- 59% left carotid stenosis however the CT angiogram shows no significant carotid disease on the left.  I will arrange a follow-up carotid duplex scan in 6 months.  I suspect she will be managed with maximal medical therapy including aspirin, Plavix, aggressive statin therapy.  I have also emphasized with her the importance of getting off the cigarettes completely.  We also discussed nutrition and exercise.  Vascular surgery will be available as needed.   SUBJECTIVE:   No new symptoms overnight.  PHYSICAL EXAM:   Vitals:   08/19/21 0300 08/19/21 0400 08/19/21 0500 08/19/21 0649  BP: (!) 115/94 118/81 (!) 142/92 (!) 157/93  Pulse: 86 88 88 96  Resp:  12 13 16   Temp:      TempSrc:      SpO2: 97% 96% 96% 99%  Weight:      Height:       Good strength in the upper extremities and lower extremities  LABS:   Lab Results  Component Value Date   WBC 10.1 08/19/2021   HGB 13.5 08/19/2021   HCT 40.3 08/19/2021   MCV 90.4 08/19/2021   PLT 312 08/19/2021   Lab Results  Component Value Date   CREATININE 0.57 08/19/2021   Lab Results  Component Value Date   INR 1.0 08/18/2021    PROBLEM LIST:    Principal Problem:   Amaurosis fugax of right eye Active Problems:   Hypertension   Coronary artery disease completely occluded RCA with nonobstructive disease elsewhere based on coronary CT   CURRENT MEDS:    aspirin EC  81 mg Oral Daily   rosuvastatin  20 mg Oral Daily    08/20/2021 Office: [phone] 08/19/2021

## 2021-08-19 NOTE — Progress Notes (Signed)
Patient left prior to my seeing the patient, but I did discuss the case with karen kirby-graham.   Ritta Slot, MD [facility] [phone]  If 7pm- 7am, please page neurology on call as listed in AMION.

## 2021-08-19 NOTE — Discharge Summary (Signed)
Physician Discharge Summary  Samantha Russo NGE:[id number] DOB: 11-Mar-1961 DOA: 08/18/2021  PCP: Pcp, No  Admit date: 08/18/2021  Discharge date: 08/19/2021  Admitted From: Home.  Disposition:  Home  Recommendations for Outpatient Follow-up:  Follow up with PCP in 1-2 weeks. Please obtain BMP/CBC in one week. Advised to follow-up with vascular surgery Dr. Edilia Bo as scheduled. Advised to take aspirin 81 mg daily, Plavix 75 mg daily and Crestor 40 mg daily. Repeat carotid duplex in 6 months. Advised to follow-up with neurology in 3 to 4 weeks.  Home Health:None Equipment/Devices:None  Discharge Condition: Good CODE STATUS:Full code Diet recommendation: Heart Healthy  Brief [facility] Course: This 60 years old female with PMH significant for hypertension, tobacco abuse, CAD, s/p MI last month, No left heart cath done but had a coronary CT which showed chronic looking occlusion of RCA (with collaterals).  Patient has reported over last couple of weeks she has had 2 episodes of loss of vision in right eye, '' curtain coming down over vision".  Symptoms resolved each time.  Patient went to see cardiologist where carotid duplex was completed which shows 80 to 99% right ICA stenosis, Left 40 to 59% stenosis.  Patient was sent in the ED for amaurosis fugax. Patient was admitted for amaurosis fugax. CT head unremarkable, vascular surgery was consulted for severe carotid stenosis.  Cardiology was consulted for preop clearance,  patient might require intervention.  CT angiogram showed right internal carotid artery is occluded at the base of the skull.  Vascular surgery states there is nothing to offer from surgical standpoint,  advised maximize medical therapy ( aspirin Plavix and axis with statin therapy).  Repeat carotid duplex in 6 months and neuro follow-up.  Patient blood pressure medications were continued.  Neurology was consulted.  Neurology recommended to increase Crestor to 40 mg,   continue aspirin and Plavix and follow-up with neurology in 1 months.  Patient wants to go home, patient has seen neurology nurse practitioner, she did not want to wait for neurologist she wants to be discharged.  Patient is being discharged and has left.  Discharge Diagnoses:  Principal Problem:   Amaurosis fugax of right eye Active Problems:   Hypertension   Coronary artery disease completely occluded RCA with nonobstructive disease elsewhere based on coronary CT   Discharge Instructions: Follow up with PCP in 1-2 weeks Please obtain BMP/CBC in one week Advised to follow-up with vascular surgery Dr. Edilia Bo is a scheduled. Advised to take aspirin 81 mg daily, Plavix 75 mg daily and Crestor 40 mg daily. Repeat carotid duplex in 6 months.   Discharge Instructions     Call MD for:  difficulty breathing, headache or visual disturbances   Complete by: As directed    Call MD for:  persistant dizziness or light-headedness   Complete by: As directed    Call MD for:  persistant nausea and vomiting   Complete by: As directed    Diet - low sodium heart healthy   Complete by: As directed    Diet Carb Modified   Complete by: As directed    Discharge instructions   Complete by: As directed    Advised to follow-up with primary care physician in 1 week. Advised to follow-up with vascular surgery Dr. Edilia Bo is a scheduled. Advised to take aspirin 81 mg daily, Plavix 75 mg daily and Crestor 40 mg daily   Increase activity slowly   Complete by: As directed       Allergies as of  08/19/2021   No Known Allergies      Medication List     TAKE these medications    aspirin EC 81 MG tablet Take 1 tablet (81 mg total) by mouth daily. Swallow whole.   clopidogrel 75 MG tablet Commonly known as: PLAVIX Take 1 tablet (75 mg total) by mouth daily. Start taking on: August 20, 2021   diazepam 10 MG tablet Commonly known as: VALIUM Take 5 mg by mouth daily as needed for anxiety.    nebivolol 5 MG tablet Commonly known as: Bystolic Take 1 tablet (5 mg total) by mouth daily.   nitroGLYCERIN 0.4 MG/SPRAY spray Commonly known as: Nitrolingual Place 1 spray under the tongue every 5 (five) minutes x 3 doses as needed for chest pain (Do not exceed 3 times in a day and Call 911 if chest pain continues or worsening).   rosuvastatin 40 MG tablet Commonly known as: CRESTOR Take 1 tablet (40 mg total) by mouth daily. What changed:  medication strength how much to take   spironolactone 25 MG tablet Commonly known as: ALDACTONE Take 1 tablet (25 mg total) by mouth daily.   Vitamin D (Ergocalciferol) 1.25 MG (50000 UNIT) Caps capsule Commonly known as: DRISDOL Take 50,000 Units by mouth every 3 (three) days.        Follow-up Information     Chuck Hint, MD Follow up in 4 week(s).   Specialties: Vascular Surgery, Cardiology Contact information: [address] [place] [address] [phone]         [facility] Follow up in 1 month(s).   Contact information: [address]     [address] [place] [address] [phone]               No Known Allergies  Consultations: Neurology Vascular surgery   Procedures/Studies: CT ANGIO HEAD W OR WO CONTRAST  Result Date: 08/19/2021 CLINICAL DATA:  Stroke/TIA, assess intracranial arteries Stroke/TIA, assess extracranial arteries EXAM: CT ANGIOGRAPHY HEAD AND NECK TECHNIQUE: Multidetector CT imaging of the head and neck was performed using the standard protocol during bolus administration of intravenous contrast. Multiplanar CT image reconstructions and MIPs were obtained to evaluate the vascular anatomy. Carotid stenosis measurements (when applicable) are obtained utilizing NASCET criteria, using the distal internal carotid diameter as the denominator. CONTRAST:  37mL OMNIPAQUE IOHEXOL 350 MG/ML SOLN COMPARISON:  None. FINDINGS: CT HEAD FINDINGS Brain: There is  no mass, hemorrhage or extra-axial collection. The size and configuration of the ventricles and extra-axial CSF spaces are normal. There is no acute or chronic infarction. The brain parenchyma is normal. Skull: The visualized skull base, calvarium and extracranial soft tissues are normal. Sinuses/Orbits: No fluid levels or advanced mucosal thickening of the visualized paranasal sinuses. No mastoid or middle ear effusion. The orbits are normal. CTA NECK FINDINGS SKELETON: There is no bony spinal canal stenosis. No lytic or blastic lesion. OTHER NECK: Normal pharynx, larynx and major salivary glands. No cervical lymphadenopathy. Unremarkable thyroid gland. UPPER CHEST: No pneumothorax or pleural effusion. No nodules or masses. AORTIC ARCH: There is calcific atherosclerosis of the aortic arch. There is no aneurysm, dissection or hemodynamically significant stenosis of the visualized portion of the aorta. Conventional 3 vessel aortic branching pattern. The visualized proximal subclavian arteries are widely patent. RIGHT CAROTID SYSTEM: No dissection, occlusion or aneurysm. There is mixed density atherosclerosis extending into the proximal ICA, resulting in 50% stenosis. There is severe stenosis of the distal right ICA. LEFT CAROTID SYSTEM: No dissection,  occlusion or aneurysm. Mild atherosclerotic calcification at the carotid bifurcation without hemodynamically significant stenosis. VERTEBRAL ARTERIES: Left dominant configuration. Both origins are clearly patent. Mild calcification of the distal left V1 segment. Otherwise, the V1-V3 segments are normal. CTA HEAD FINDINGS POSTERIOR CIRCULATION: --Vertebral arteries: Normal V4 segments. --Inferior cerebellar arteries: Normal. --Basilar artery: Normal. --Superior cerebellar arteries: Normal. --Posterior cerebral arteries (PCA): Normal. ANTERIOR CIRCULATION: --Intracranial internal carotid arteries: Skull base segments of the right ICA are occluded. There is calcific  atherosclerosis of the left ICA. --Anterior cerebral arteries (ACA): Normal. Both A1 segments are present. Patent anterior communicating artery (a-comm). --Middle cerebral arteries (MCA): Normal. VENOUS SINUSES: As permitted by contrast timing, patent. ANATOMIC VARIANTS: None Review of the MIP images confirms the above findings. IMPRESSION: 1. Right ICA occlusion at the skull base. Severe stenosis of the distal cervical right ICA. 2. Cerebral arteries are patent. 3. 50% stenosis of the proximal right internal carotid artery secondary to mixed density atherosclerosis. Aortic Atherosclerosis (ICD10-I70.0). Electronically Signed   By: Deatra Robinson M.D.   On: 08/19/2021 00:56   DG Chest 2 View  Result Date: 07/22/2021 CLINICAL DATA:  Chest pain.  Nausea and dizziness. EXAM: CHEST - 2 VIEW COMPARISON:  None. FINDINGS: The heart size is borderline to mildly enlarged. The hila and mediastinum are normal. No pneumothorax. No nodules or masses. Mild atelectasis in the left base. No suspicious infiltrate. No overt edema. IMPRESSION: No active cardiopulmonary disease. Electronically Signed   By: Gerome Sam III M.D   On: 07/22/2021 10:15   CT Head Wo Contrast  Result Date: 07/22/2021 CLINICAL DATA:  Nausea and dizziness for 2.5 weeks. Intermittent chest pain radiating to the back. No reported injury. EXAM: CT HEAD WITHOUT CONTRAST TECHNIQUE: Contiguous axial images were obtained from the base of the skull through the vertex without intravenous contrast. COMPARISON:  None. FINDINGS: Brain: No evidence of parenchymal hemorrhage or extra-axial fluid collection. No mass lesion, mass effect, or midline shift. No CT evidence of acute infarction. Cerebral volume is age appropriate. No ventriculomegaly. Vascular: No acute abnormality. Skull: No evidence of calvarial fracture. Sinuses/Orbits: The visualized paranasal sinuses are essentially clear. Other:  The mastoid air cells are unopacified. IMPRESSION: Negative head CT.  No evidence of acute intracranial abnormality. Electronically Signed   By: Delbert Phenix M.D.   On: 07/22/2021 11:06   CT ANGIO NECK W OR WO CONTRAST  Result Date: 08/19/2021 CLINICAL DATA:  Stroke/TIA, assess intracranial arteries Stroke/TIA, assess extracranial arteries EXAM: CT ANGIOGRAPHY HEAD AND NECK TECHNIQUE: Multidetector CT imaging of the head and neck was performed using the standard protocol during bolus administration of intravenous contrast. Multiplanar CT image reconstructions and MIPs were obtained to evaluate the vascular anatomy. Carotid stenosis measurements (when applicable) are obtained utilizing NASCET criteria, using the distal internal carotid diameter as the denominator. CONTRAST:  75mL OMNIPAQUE IOHEXOL 350 MG/ML SOLN COMPARISON:  None. FINDINGS: CT HEAD FINDINGS Brain: There is no mass, hemorrhage or extra-axial collection. The size and configuration of the ventricles and extra-axial CSF spaces are normal. There is no acute or chronic infarction. The brain parenchyma is normal. Skull: The visualized skull base, calvarium and extracranial soft tissues are normal. Sinuses/Orbits: No fluid levels or advanced mucosal thickening of the visualized paranasal sinuses. No mastoid or middle ear effusion. The orbits are normal. CTA NECK FINDINGS SKELETON: There is no bony spinal canal stenosis. No lytic or blastic lesion. OTHER NECK: Normal pharynx, larynx and major salivary glands. No cervical lymphadenopathy. Unremarkable thyroid gland. UPPER CHEST:  No pneumothorax or pleural effusion. No nodules or masses. AORTIC ARCH: There is calcific atherosclerosis of the aortic arch. There is no aneurysm, dissection or hemodynamically significant stenosis of the visualized portion of the aorta. Conventional 3 vessel aortic branching pattern. The visualized proximal subclavian arteries are widely patent. RIGHT CAROTID SYSTEM: No dissection, occlusion or aneurysm. There is mixed density atherosclerosis  extending into the proximal ICA, resulting in 50% stenosis. There is severe stenosis of the distal right ICA. LEFT CAROTID SYSTEM: No dissection, occlusion or aneurysm. Mild atherosclerotic calcification at the carotid bifurcation without hemodynamically significant stenosis. VERTEBRAL ARTERIES: Left dominant configuration. Both origins are clearly patent. Mild calcification of the distal left V1 segment. Otherwise, the V1-V3 segments are normal. CTA HEAD FINDINGS POSTERIOR CIRCULATION: --Vertebral arteries: Normal V4 segments. --Inferior cerebellar arteries: Normal. --Basilar artery: Normal. --Superior cerebellar arteries: Normal. --Posterior cerebral arteries (PCA): Normal. ANTERIOR CIRCULATION: --Intracranial internal carotid arteries: Skull base segments of the right ICA are occluded. There is calcific atherosclerosis of the left ICA. --Anterior cerebral arteries (ACA): Normal. Both A1 segments are present. Patent anterior communicating artery (a-comm). --Middle cerebral arteries (MCA): Normal. VENOUS SINUSES: As permitted by contrast timing, patent. ANATOMIC VARIANTS: None Review of the MIP images confirms the above findings. IMPRESSION: 1. Right ICA occlusion at the skull base. Severe stenosis of the distal cervical right ICA. 2. Cerebral arteries are patent. 3. 50% stenosis of the proximal right internal carotid artery secondary to mixed density atherosclerosis. Aortic Atherosclerosis (ICD10-I70.0). Electronically Signed   By: Deatra Robinson M.D.   On: 08/19/2021 00:56   CT CORONARY MORPH W/CTA COR W/SCORE W/CA W/CM &/OR WO/CM  Addendum Date: 08/03/2021   ADDENDUM REPORT: 08/03/2021 19:09 CLINICAL DATA:  Chest pain EXAM: Cardiac/Coronary CTA TECHNIQUE: A non-contrast, gated CT scan was obtained with axial slices of 3 mm through the heart for calcium scoring. Calcium scoring was performed using the Agatston method. A 100 kV prospective, gated, contrast cardiac scan was obtained. Gantry rotation speed was 250  msecs and collimation was 0.6 mm. Two sublingual nitroglycerin tablets (0.8 mg) were given. The 3D data set was reconstructed in 5% intervals of the 35-75% of the R-R cycle. Diastolic phases were analyzed on a dedicated workstation using MPR, MIP, and VRT modes. The patient received 95 cc of contrast. FINDINGS: Image quality: Excellent. Noise artifact is: Limited. Coronary Arteries:  Normal coronary origin.  Right dominance. Left main: The left main is a large caliber vessel with a normal take off from the left coronary cusp that bifurcates to form a left anterior descending artery and a left circumflex artery. There is no plaque or stenosis. Left anterior descending artery: There is minimal non-calcified plaque (<25%) in the mid LAD. There is minimal non-calcified plaque in the first diagonal (<25%). The second diagonal is patent. Left circumflex artery: The LCX is non-dominant and patent with no evidence of plaque or stenosis. OM1 contains a moderate non-calcified plaque (50-69%). The second OM branch is patent. Right coronary artery: The RCA is dominant with normal take off from the right coronary cusp. The proximal RCA contains minimal calcified plaque (<25%). The mid RCA appears occluded (100%), with flow distal suspicious for chronic total occlusion as the PDA fills with contrast. Right Atrium: Right atrial size is within normal limits. Right Ventricle: The right ventricular cavity is within normal limits. Left Atrium: Left atrial size is normal in size with no left atrial appendage filling defect. Left Ventricle: The ventricular cavity size is within normal limits. There are no stigmata  of prior infarction. There is no abnormal filling defect. Pulmonary arteries: Normal in size without proximal filling defect. Pulmonary veins: Normal pulmonary venous drainage. Pericardium: Normal thickness with no significant effusion or calcium present. Cardiac valves: The aortic valve is trileaflet without significant  calcification. The mitral valve is normal structure without significant calcification. Aorta: Normal caliber with no significant disease. Extra-cardiac findings: See attached radiology report for non-cardiac structures. IMPRESSION: 1. Coronary calcium score of 62.8. This was 87th percentile for age-, sex, and race-matched controls. 2. Normal coronary origin with right dominance. 3. Minimal CAD (<25%) in the LAD. 4. OM1 contains a moderate non-calcified plaque (50-69%). 5. The mid RCA appears occluded (100%) with contrast in the distal RCA suspicious for chronic total occlusion with collateral flow. RECOMMENDATIONS: 1. Moderate stenosis in OM1 and concerns for total coronary occlusion of the mid RCA. Cardiac catheterization is recommended. CT FFR will be submitted for the OM1. Consider symptom-guided anti-ischemic pharmacotherapy as well as risk factor modification per guideline directed care. Lennie Odor, MD Electronically Signed   By: Lennie Odor   On: 08/03/2021 19:09   Result Date: 08/03/2021 EXAM: OVER-READ INTERPRETATION  CT CHEST The following report is an over-read performed by radiologist Dr. Jeronimo Greaves of [facility] on 08/03/2021. This over-read does not include interpretation of cardiac or coronary anatomy or pathology. The coronary CTA interpretation by the cardiologist is attached. COMPARISON:  07/22/2021 chest radiograph. FINDINGS: Vascular: Aortic atherosclerosis. No central pulmonary embolism, on this non-dedicated study. Mediastinum/Nodes: No imaged thoracic adenopathy. Lungs/Pleura: No pleural fluid.  Clear imaged lungs. Upper Abdomen: Normal imaged portions of the liver. 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 Left breast implant is incompletely imaged. No acute osseous abnormality. IMPRESSION: 1. No acute findings in the imaged extracardiac chest. 2. Aortic Atherosclerosis (ICD10-I70.0). Electronically Signed: By: Jeronimo Greaves M.D. On: 08/03/2021 14:51    CT CORONARY FRACTIONAL FLOW RESERVE DATA PREP  Result Date: 08/04/2021 EXAM: CT FFR analysis was performed on the original cardiac CTA dataset. Diagrammatic representation of the CT FFR analysis is provided in a separate PDF document in PACS. This dictation was created using the PDF document and an interactive 3D model of the results. The 3D model is not available in the EMR/PACS. INTERPRETATION: CT FFR provides simultaneous calculation of pressure and flow across the entire coronary tree. For clinical decision making, CT FFR values should be obtained 1-2 cm distal to the lower border of each stenosis measured. Coronary CTA-related artifacts may impair the diagnostic accuracy of the original cardiac CTA and FFR CT results. *Due to the fact that CT FFR represents a mathematically-derived analysis, it is recommended that the results be interpreted as follows: *CT FFR >0.80: Low likelihood of hemodynamic significance. *CT FFR 0.76-0.80: Borderline likelihood of hemodynamic significance. *CT FFR =< 0.75: High likelihood of hemodynamic significance. *Coronary CT Angiography-derived Fractional Flow Reserve Testing in Patients with Stable Coronary Artery Disease: Recommendations on Interpretation and Reporting. Radiology: Cardiothoracic Imaging. 2019;1(5):[id number] FINDINGS: 1. Left Main: 0.96; no significant stenosis. 2. Proximal LAD: 0.94; no significant stenosis. 3. Mid LAD: 0.82; no significant stenosis. 4. D1: 0.83; no significant stenosis. 5. LCX: 0.91 no significant stenosis. 6. OM1: 0.86; no significant stenosis. 7. RCA: Not modeled as occluded (100%). IMPRESSION: 1.  No significant stenoses by CT FFR. 2.  OM2 negative (CT FFR = 0.86). 3.  RCA not modeled as occluded (100%). Lennie Odor, MD Electronically Signed   By: Lennie Odor   On: 08/04/2021 07:54   ECHOCARDIOGRAM COMPLETE  Result Date: 08/07/2021    ECHOCARDIOGRAM REPORT   Patient Name:   Samantha Russo Date of Exam: 08/07/2021 Medical Rec #:   [id number]          Height:       64.0 in Accession #:    [id number]         Weight:       185.0 lb Date of Birth:  1961-10-08         BSA:          1.893 m Patient Age:    59 years           BP:           156/74 mmHg Patient Gender: F                  HR:           92 bpm. Exam Location:  [address] Procedure: 2D Echo, Cardiac Doppler and Color Doppler Indications:    R07.9 Chest pain  History:        Patient has no prior history of Echocardiogram examinations.                 Signs/Symptoms:Chest Pain and Dizziness; Risk                 Factors:Hypertension and Current Smoker.  Sonographer:    Samule Ohm RDCS Referring Phys: [id number] Georgeanna Lea  Sonographer Comments: Technically difficult study due to poor echo windows. IMPRESSIONS  1. Left ventricular ejection fraction, by estimation, is 55 to 60%. The left ventricle has normal function. The left ventricle has no regional wall motion abnormalities. There is moderate left ventricular hypertrophy. Left ventricular diastolic parameters were normal.  2. Right ventricular systolic function is normal. The right ventricular size is normal. Tricuspid regurgitation signal is inadequate for assessing PA pressure.  3. A small pericardial effusion is present.  4. The mitral valve is normal in structure. No evidence of mitral valve regurgitation. No evidence of mitral stenosis.  5. The aortic valve was not well visualized. Aortic valve regurgitation is not visualized. No aortic stenosis is present.  6. The inferior vena cava is normal in size with greater than 50% respiratory variability, suggesting right atrial pressure of 3 mmHg. FINDINGS  Left Ventricle: Left ventricular ejection fraction, by estimation, is 55 to 60%. The left ventricle has normal function. The left ventricle has no regional wall motion abnormalities. The left ventricular internal cavity size was normal in size. There is  moderate left ventricular hypertrophy. Left ventricular diastolic  parameters were normal. Right Ventricle: The right ventricular size is normal. No increase in right ventricular wall thickness. Right ventricular systolic function is normal. Tricuspid regurgitation signal is inadequate for assessing PA pressure. Left Atrium: Left atrial size was normal in size. Right Atrium: Right atrial size was normal in size. Pericardium: A small pericardial effusion is present. Mitral Valve: The mitral valve is normal in structure. No evidence of mitral valve regurgitation. No evidence of mitral valve stenosis. Tricuspid Valve: The tricuspid valve is normal in structure. Tricuspid valve regurgitation is trivial. Aortic Valve: The aortic valve was not well visualized. Aortic valve regurgitation is not visualized. No aortic stenosis is present. Pulmonic Valve: The pulmonic valve was not well visualized. Pulmonic valve regurgitation is not visualized. Aorta: The aortic root and ascending aorta are structurally normal, with no evidence of dilitation. Venous: The inferior vena cava is normal in size with greater than 50% respiratory variability,  suggesting right atrial pressure of 3 mmHg. IAS/Shunts: No atrial level shunt detected by color flow Doppler.  LEFT VENTRICLE PLAX 2D LVIDd:         4.10 cm  Diastology LVIDs:         2.90 cm  LV e' medial:    7.07 cm/s LV PW:         1.20 cm  LV E/e' medial:  11.0 LV IVS:        1.30 cm  LV e' lateral:   7.07 cm/s LVOT diam:     1.90 cm  LV E/e' lateral: 11.0 LV SV:         55 LV SV Index:   29 LVOT Area:     2.84 cm  RIGHT VENTRICLE             IVC RV S prime:     10.30 cm/s  IVC diam: 0.90 cm TAPSE (M-mode): 1.5 cm LEFT ATRIUM             Index       RIGHT ATRIUM           Index LA diam:        3.20 cm 1.69 cm/m  RA Pressure: 3.00 mmHg LA Vol (A2C):   27.1 ml 14.32 ml/m RA Area:     8.83 cm LA Vol (A4C):   43.8 ml 23.14 ml/m RA Volume:   15.70 ml  8.30 ml/m LA Biplane Vol: 37.4 ml 19.76 ml/m  AORTIC VALVE LVOT Vmax:   94.00 cm/s LVOT Vmean:  65.600  cm/s LVOT VTI:    0.194 m  AORTA Ao Root diam: 2.70 cm Ao Asc diam:  3.10 cm MITRAL VALVE               TRICUSPID VALVE MV Area (PHT): 3.97 cm    Estimated RAP:  3.00 mmHg MV Decel Time: 191 msec MV E velocity: 78.00 cm/s  SHUNTS MV A velocity: 87.40 cm/s  Systemic VTI:  0.19 m MV E/A ratio:  0.89        Systemic Diam: 1.90 cm Epifanio Lesches MD Electronically signed by Epifanio Lesches MD Signature Date/Time: 08/07/2021/8:21:14 PM    Final    VAS US CAROTID  Result Date: 08/18/2021 Carotid Arterial Duplex Study Patient Name:  EGYPT WELCOME  Date of Exam:   08/18/2021 Medical Rec #: [id number]           Accession #:    [id number] Date of Birth: 13-Apr-1961          Patient Gender: F Patient Age:   72 years Exam Location:  [facility] Procedure:      VAS US CAROTID Referring Phys: Gypsy Balsam --------------------------------------------------------------------------------  Indications:                            Visual disturbance and Patient has had                                         constant dizziness since 07/07/2021.                                         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                                         Patient denies any other                                         cerebrovascular symptoms. Risk Factors:                           Hypertension, hyperlipidemia, current                                         smoker, coronary artery disease. Comparison Study:                       None Pre-Surgical Evaluation & Surgical      High resistant low flow seen in the Correlation                             proximal CCA; could there be a more                                         proximal stenosis? ICA is normal past                                         the stenosis. Anatomy on the right is                                         within normal limits.Right bifurcation                                          is located near the [place]. Performing Technologist: Carlos AmericanAlecia Mackin RVT, RDCS (AE), RDMS  Examination Guidelines: A complete evaluation includes B-mode imaging, spectral Doppler, color Doppler, and power Doppler as needed of all accessible portions of each vessel. Bilateral testing is considered an integral part of a complete examination. Limited examinations for reoccurring indications may be performed as noted.  Right Carotid Findings: +----------+--------+--------+--------+---------------------+------------------+           PSV cm/sEDV cm/sStenosisPlaque Description   Comments           +----------+--------+--------+--------+---------------------+------------------+ CCA Prox  27      6                                    high resistant  flow               +----------+--------+--------+--------+---------------------+------------------+ CCA Mid   17      7                                                       +----------+--------+--------+--------+---------------------+------------------+ CCA Distal25      12                                   intimal thickening +----------+--------+--------+--------+---------------------+------------------+ ICA Prox  683     358     80-99%  heterogenous and                                                          irregular                               +----------+--------+--------+--------+---------------------+------------------+ ICA Mid   29      14                                                      +----------+--------+--------+--------+---------------------+------------------+ ICA Distal45      22                                                      +----------+--------+--------+--------+---------------------+------------------+ ECA       203     52      >50%                                             +----------+--------+--------+--------+---------------------+------------------+ +----------+--------+-------+---------+-------------------+           PSV cm/sEDV cmsDescribe Arm Pressure (mmHG) +----------+--------+-------+---------+-------------------+ [id number]            [id number]                 +----------+--------+-------+---------+-------------------+ +---------+--------+--+--------+--+---------+ VertebralPSV cm/s70EDV cm/s31Antegrade +---------+--------+--+--------+--+---------+  Left Carotid Findings: +----------+--------+--------+--------+--------------------+-------------------+           PSV cm/sEDV cm/sStenosisPlaque Description  Comments            +----------+--------+--------+--------+--------------------+-------------------+ CCA Prox  124     42                                                      +----------+--------+--------+--------+--------------------+-------------------+ CCA Mid   81      35              homogeneous and  smooth                                  +----------+--------+--------+--------+--------------------+-------------------+ CCA Distal105     46      <50%    homogeneous and     Hypoechoic plaque                                     smooth              measuring .36cm AP  +----------+--------+--------+--------+--------------------+-------------------+ ICA Prox  119     45      40-59%                                          +----------+--------+--------+--------+--------------------+-------------------+ ICA Mid   129     48      40-59%                      tortuous            +----------+--------+--------+--------+--------------------+-------------------+ ICA Distal64      24                                                      +----------+--------+--------+--------+--------------------+-------------------+ ECA       267     82       >50%                                            +----------+--------+--------+--------+--------------------+-------------------+ +----------+--------+--------+----------------+-------------------+           PSV cm/sEDV cm/sDescribe        Arm Pressure (mmHG) +----------+--------+--------+----------------+-------------------+ [id number]             Multiphasic, [id number]                 +----------+--------+--------+----------------+-------------------+ +---------+--------+--+--------+--+---------+ VertebralPSV cm/s89EDV cm/s35Antegrade +---------+--------+--+--------+--+---------+    Findings reported to Dr. Flora Lipps (DOD) at 4:00 pm. Patient sent to ER with husband. . Summary: Right Carotid: Velocities in the right ICA are consistent with a 80-99%                stenosis.                 Low flow in the CCA with a question of a more proximal stenosis. Left Carotid: Velocities in the left ICA are consistent with a 40-59% stenosis.               Non-hemodynamically significant plaque <50% noted in the CCA. Vertebrals:  Bilateral vertebral arteries demonstrate antegrade flow. Subclavians: Right subclavian artery flow was disturbed. Normal flow              hemodynamics were seen in the left subclavian artery. *See table(s) above for measurements and observations.  Vascular consult recommended. Electronically signed by Julien Nordmann MD on 08/18/2021 at 4:30:55 PM.    Final       Subjective: Patient was seen and examined at bedside.  Overnight events noted.  Patient reports feeling much better and wants to be discharged.  Patient cleared from vascular surgery and neurology.  Discharge Exam: Vitals:   08/19/21 1600 08/19/21 1710  BP: (!) 169/96 (!) 145/99  Pulse: 93 88  Resp:  16  Temp:    SpO2: 98% 98%   Vitals:   08/19/21 1400 08/19/21 1550 08/19/21 1600 08/19/21 1710  BP: (!) 140/99 (!) 144/98 (!) 169/96 (!) 145/99  Pulse: 90 97 93 88  Resp:  20  16  Temp:      TempSrc:       SpO2: 95% 96% 98% 98%  Weight:      Height:        General: Pt is alert, awake, not in acute distress Cardiovascular: RRR, S1/S2 +, no rubs, no gallops Respiratory: CTA bilaterally, no wheezing, no rhonchi Abdominal: Soft, NT, ND, bowel sounds + Extremities: no edema, no cyanosis    The results of significant diagnostics from this hospitalization (including imaging, microbiology, ancillary and laboratory) are listed below for reference.     Microbiology: Recent Results (from the past 240 hour(s))  Resp Panel by RT-PCR (Flu A&B, Covid) Nasopharyngeal Swab     Status: None   Collection Time: 08/18/21  6:58 PM   Specimen: Nasopharyngeal Swab; Nasopharyngeal(NP) swabs in vial transport medium  Result Value Ref Range Status   SARS Coronavirus 2 by RT PCR NEGATIVE NEGATIVE Final    Comment: (NOTE) SARS-CoV-2 target nucleic acids are NOT DETECTED.  The SARS-CoV-2 RNA is generally detectable in upper respiratory specimens during the acute phase of infection. The lowest concentration of SARS-CoV-2 viral copies this assay can detect is 138 copies/mL. A negative result does not preclude SARS-Cov-2 infection and should not be used as the sole basis for treatment or other patient management decisions. A negative result may occur with  improper specimen collection/handling, submission of specimen other than nasopharyngeal swab, presence of viral mutation(s) within the areas targeted by this assay, and inadequate number of viral copies(<138 copies/mL). A negative result must be combined with clinical observations, patient history, and epidemiological information. The expected result is Negative.  Fact Sheet for Patients:  BloggerCourse.com  Fact Sheet for Healthcare Providers:  SeriousBroker.it  This test is no t yet approved or cleared by the [place] FDA and  has been authorized for detection and/or diagnosis of SARS-CoV-2  by FDA under an Emergency Use Authorization (EUA). This EUA will remain  in effect (meaning this test can be used) for the duration of the COVID-19 declaration under Section 564(b)(1) of the Act, 21 U.S.C.section 360bbb-3(b)(1), unless the authorization is terminated  or revoked sooner.       Influenza A by PCR NEGATIVE NEGATIVE Final   Influenza B by PCR NEGATIVE NEGATIVE Final    Comment: (NOTE) The Xpert Xpress SARS-CoV-2/FLU/RSV plus assay is intended as an aid in the diagnosis of influenza from Nasopharyngeal swab specimens and should not be used as a sole basis for treatment. Nasal washings and aspirates are unacceptable for Xpert Xpress SARS-CoV-2/FLU/RSV testing.  Fact Sheet for Patients: BloggerCourse.com  Fact Sheet for Healthcare Providers: SeriousBroker.it  This test is not yet approved or cleared by the [place] FDA and has been authorized for detection and/or diagnosis of SARS-CoV-2 by FDA under an Emergency Use Authorization (EUA). This EUA will remain in effect (meaning this test can be used) for the duration of the COVID-19 declaration under Section 564(b)(1) of the Act, 21 U.S.C. section 360bbb-3(b)(1), unless the authorization  is terminated or revoked.  Performed at [facility], [address], [place], [place] [address]      Labs: BNP (last 3 results) No results for input(s): BNP in the last 8760 hours. Basic Metabolic Panel: Recent Labs  Lab 08/18/21 1748 08/19/21 0249  NA 131* 135  K 4.0 3.5  CL 101 102  CO2 21* 24  GLUCOSE 83 109*  BUN 12 10  CREATININE 0.56 0.57  CALCIUM 9.2 9.1   Liver Function Tests: Recent Labs  Lab 08/18/21 1748  AST 26  ALT 32  ALKPHOS 91  BILITOT 0.3  PROT 7.1  ALBUMIN 3.8   No results for input(s): LIPASE, AMYLASE in the last 168 hours. No results for input(s): AMMONIA in the last 168 hours. CBC: Recent Labs  Lab 08/18/21 1748  08/19/21 0250  WBC 10.1 10.1  NEUTROABS 6.3  --   HGB 14.2 13.5  HCT 43.2 40.3  MCV 90.9 90.4  PLT 349 312   Cardiac Enzymes: No results for input(s): CKTOTAL, CKMB, CKMBINDEX, TROPONINI in the last 168 hours. BNP: Invalid input(s): POCBNP CBG: No results for input(s): GLUCAP in the last 168 hours. D-Dimer No results for input(s): DDIMER in the last 72 hours. Hgb A1c No results for input(s): HGBA1C in the last 72 hours. Lipid Profile No results for input(s): CHOL, HDL, LDLCALC, TRIG, CHOLHDL, LDLDIRECT in the last 72 hours. Thyroid function studies No results for input(s): TSH, T4TOTAL, T3FREE, THYROIDAB in the last 72 hours.  Invalid input(s): FREET3 Anemia work up No results for input(s): VITAMINB12, FOLATE, FERRITIN, TIBC, IRON, RETICCTPCT in the last 72 hours. Urinalysis    Component Value Date/Time   COLORURINE YELLOW 08/19/2021 0950   APPEARANCEUR CLEAR 08/19/2021 0950   LABSPEC 1.018 08/19/2021 0950   PHURINE 6.0 08/19/2021 0950   GLUCOSEU NEGATIVE 08/19/2021 0950   HGBUR SMALL (A) 08/19/2021 0950   BILIRUBINUR NEGATIVE 08/19/2021 0950   KETONESUR NEGATIVE 08/19/2021 0950   PROTEINUR NEGATIVE 08/19/2021 0950   NITRITE NEGATIVE 08/19/2021 0950   LEUKOCYTESUR NEGATIVE 08/19/2021 0950   Sepsis Labs Invalid input(s): PROCALCITONIN,  WBC,  LACTICIDVEN Microbiology Recent Results (from the past 240 hour(s))  Resp Panel by RT-PCR (Flu A&B, Covid) Nasopharyngeal Swab     Status: None   Collection Time: 08/18/21  6:58 PM   Specimen: Nasopharyngeal Swab; Nasopharyngeal(NP) swabs in vial transport medium  Result Value Ref Range Status   SARS Coronavirus 2 by RT PCR NEGATIVE NEGATIVE Final    Comment: (NOTE) SARS-CoV-2 target nucleic acids are NOT DETECTED.  The SARS-CoV-2 RNA is generally detectable in upper respiratory specimens during the acute phase of infection. The lowest concentration of SARS-CoV-2 viral copies this assay can detect is 138 copies/mL. A  negative result does not preclude SARS-Cov-2 infection and should not be used as the sole basis for treatment or other patient management decisions. A negative result may occur with  improper specimen collection/handling, submission of specimen other than nasopharyngeal swab, presence of viral mutation(s) within the areas targeted by this assay, and inadequate number of viral copies(<138 copies/mL). A negative result must be combined with clinical observations, patient history, and epidemiological information. The expected result is Negative.  Fact Sheet for Patients:  BloggerCourse.com  Fact Sheet for Healthcare Providers:  SeriousBroker.it  This test is no t yet approved or cleared by the [place] FDA and  has been authorized for detection and/or diagnosis of SARS-CoV-2 by FDA under an Emergency Use Authorization (EUA). This EUA will remain  in effect (meaning this test can be used) for the duration of the COVID-19 declaration under Section 564(b)(1) of the Act, 21 U.S.C.section 360bbb-3(b)(1), unless the authorization is terminated  or revoked sooner.       Influenza A by PCR NEGATIVE NEGATIVE Final   Influenza B by PCR NEGATIVE NEGATIVE Final    Comment: (NOTE) The Xpert Xpress SARS-CoV-2/FLU/RSV plus assay is intended as an aid in the diagnosis of influenza from Nasopharyngeal swab specimens and should not be used as a sole basis for treatment. Nasal washings and aspirates are unacceptable for Xpert Xpress SARS-CoV-2/FLU/RSV testing.  Fact Sheet for Patients: BloggerCourse.com  Fact Sheet for Healthcare Providers: SeriousBroker.it  This test is not yet approved or cleared by the [place] FDA and has been authorized for detection and/or diagnosis of SARS-CoV-2 by FDA under an Emergency Use Authorization (EUA). This EUA will remain in effect (meaning this test can  be used) for the duration of the COVID-19 declaration under Section 564(b)(1) of the Act, 21 U.S.C. section 360bbb-3(b)(1), unless the authorization is terminated or revoked.  Performed at [facility], [address] [place], [place] [address]      Time coordinating discharge: Over 30 minutes  SIGNED:   Cipriano Bunker, MD  [facility] 08/19/2021, 5:40 PM Pager   If 7PM-7AM, please contact night-coverage

## 2021-08-19 NOTE — Progress Notes (Addendum)
ANTICOAGULATION CONSULT NOTE  Pharmacy Consult for heparin Indication:  Severe R carotid stenosis  No Known Allergies  Patient Measurements: Height: 5\' 3"  (160 cm) Weight: 84 kg (185 lb 3 oz) IBW/kg (Calculated) : 52.4  Vital Signs: Temp: 98.3 F (36.8 C) (08/20 0123) Temp Source: Oral (08/20 0123) BP: 115/94 (08/20 0300) Pulse Rate: 86 (08/20 0300)  Labs: Recent Labs    08/18/21 1748 08/19/21 0249 08/19/21 0250  HGB 14.2  --  13.5  HCT 43.2  --  40.3  PLT 349  --  312  LABPROT 12.9  --   --   INR 1.0  --   --   HEPARINUNFRC  --  <0.10*  --   CREATININE 0.56 0.57  --      Estimated Creatinine Clearance: 77.7 mL/min (by C-G formula based on SCr of 0.57 mg/dL).   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 To start IV heparin per vascular recommendations. Planning to obtain CTA to determine procedural options.   Heparin level undetectable but drawn ~2 hours post gtt start so inaccurate.   Goal of Therapy:  Heparin level 0.3-0.5 units/ml Monitor platelets by anticoagulation protocol: Yes   Plan:  Continue heparin infusion at 1150 units/hr Will f/u heparin level ~7 hours post gtt start  46, PharmD, BCPS Please see amion for complete clinical pharmacist phone list 08/19/2021 3:32 AM

## 2021-08-19 NOTE — ED Notes (Signed)
patient is trying to leave AMA because she has still not been evaluated by neurology and feels that her medical condition is not concerning enough to anyone here. RN and spouse have spoken with her and she is adamant that she will leave if she is not evaluated immediately. She says she has been waiting since 7am and that this is unacceptable. She is also upset that her BP and anxiety have not been treated since she arrived. RN sent messages to treating physicians requesting input/eval. RN advised pt of the risk of death or significant neurological injury if her brain bleed worsens and she leaves without treatment or evaluation.

## 2021-08-19 NOTE — Discharge Instructions (Signed)
Advised to follow-up with vascular surgery Dr. Edilia Bo is a scheduled. Advised to take aspirin 81 mg daily, Plavix 75 mg daily and Crestor 40 mg daily

## 2021-08-19 NOTE — ED Notes (Addendum)
Per Dr. Lucianne Muss who spoke with Dr. Edilia Bo in vascular, discontinue Heparin drip for time being due to subarachnoid hemorrhage on CT imaging.

## 2021-08-19 NOTE — ED Notes (Signed)
Patient reports being claustrophobic and despite medication administration unable to tolerate MRI. Urine specimen was attempted to be obtained earlier in the shift but patient voided in commode and not urine cup and voided prior to moving to this room. She is aware of urine needed and drinking water at this time. She expresses concerns about having to stay in the hospital until Tuesday should she need a procedure and questions the option of going home and having procedure performed outpatient. I recommended she discuss this with the provider as this is not something I can answer.

## 2021-08-19 NOTE — Discharge Summary (Signed)
Physician Discharge Summary  Samantha Russo ZOX:[id number] DOB: 1961/09/10 DOA: 08/18/2021  PCP: Pcp, No  Admit date: 08/18/2021  Discharge date: 08/19/2021  Admitted From: Home.  Disposition:  Home  Recommendations for Outpatient Follow-up:  Follow up with PCP in 1-2 weeks. Please obtain BMP/CBC in one week. Advised to follow-up with vascular surgery Dr. Edilia Bo as scheduled. Advised to take aspirin 81 mg daily, Plavix 75 mg daily and Crestor 40 mg daily. Repeat carotid duplex in 6 months. Advised to follow-up with neurology in 3 to 4 weeks.  Home Health:None Equipment/Devices:None  Discharge Condition: Good CODE STATUS:Full code Diet recommendation: Heart Healthy  Brief [facility] Course: This 60 years old female with PMH significant for hypertension, tobacco abuse, CAD, s/p MI last month, No left heart cath done but had a coronary CT which showed chronic looking occlusion of RCA (with collaterals).  Patient has reported over last couple of weeks she has had 2 episodes of loss of vision in right eye, '' curtain coming down over vision".  Symptoms resolved each time.  Patient went to see cardiologist where carotid duplex was completed which shows 80 to 99% right ICA stenosis, Left 40 to 59% stenosis.  Patient was sent in the ED for amaurosis fugax. Patient was admitted for amaurosis fugax. CT head unremarkable, vascular surgery was consulted for severe carotid stenosis.  Cardiology was consulted for preop clearance,  patient might require intervention.  CT angiogram showed right internal carotid artery is occluded at the base of the skull.  Vascular surgery states there is nothing to offer from surgical standpoint,  advised maximize medical therapy ( aspirin Plavix and axis with statin therapy).  Repeat carotid duplex in 6 months and neuro follow-up.  Patient blood pressure medications were continued.  Neurology was consulted.  Neurology recommended to increase Crestor to 40 mg,   continue aspirin and Plavix and follow-up with neurology in 1 months.  Patient wants to go home, patient has seen neurology nurse practitioner, she did not want to wait for neurologist she wants to be discharged.  Patient is being discharged and has left.  Discharge Diagnoses:  Principal Problem:   Amaurosis fugax of right eye Active Problems:   Hypertension   Coronary artery disease completely occluded RCA with nonobstructive disease elsewhere based on coronary CT   Discharge Instructions: Follow up with PCP in 1-2 weeks Please obtain BMP/CBC in one week Advised to follow-up with vascular surgery Dr. Edilia Bo is a scheduled. Advised to take aspirin 81 mg daily, Plavix 75 mg daily and Crestor 40 mg daily. Repeat carotid duplex in 6 months.   Discharge Instructions     Call MD for:  difficulty breathing, headache or visual disturbances   Complete by: As directed    Call MD for:  persistant dizziness or light-headedness   Complete by: As directed    Call MD for:  persistant nausea and vomiting   Complete by: As directed    Diet - low sodium heart healthy   Complete by: As directed    Diet Carb Modified   Complete by: As directed    Discharge instructions   Complete by: As directed    Advised to follow-up with primary care physician in 1 week. Advised to follow-up with vascular surgery Dr. Edilia Bo is a scheduled. Advised to take aspirin 81 mg daily, Plavix 75 mg daily and Crestor 40 mg daily   Increase activity slowly   Complete by: As directed       Allergies as of  08/19/2021   No Known Allergies      Medication List     TAKE these medications    aspirin EC 81 MG tablet Take 1 tablet (81 mg total) by mouth daily. Swallow whole.   clopidogrel 75 MG tablet Commonly known as: PLAVIX Take 1 tablet (75 mg total) by mouth daily. Start taking on: August 20, 2021   diazepam 10 MG tablet Commonly known as: VALIUM Take 5 mg by mouth daily as needed for anxiety.    nebivolol 5 MG tablet Commonly known as: Bystolic Take 1 tablet (5 mg total) by mouth daily.   nitroGLYCERIN 0.4 MG/SPRAY spray Commonly known as: Nitrolingual Place 1 spray under the tongue every 5 (five) minutes x 3 doses as needed for chest pain (Do not exceed 3 times in a day and Call 911 if chest pain continues or worsening).   rosuvastatin 40 MG tablet Commonly known as: CRESTOR Take 1 tablet (40 mg total) by mouth daily. What changed:  medication strength how much to take   spironolactone 25 MG tablet Commonly known as: ALDACTONE Take 1 tablet (25 mg total) by mouth daily.   Vitamin D (Ergocalciferol) 1.25 MG (50000 UNIT) Caps capsule Commonly known as: DRISDOL Take 50,000 Units by mouth every 3 (three) days.        Follow-up Information     Chuck Hint, MD Follow up in 4 week(s).   Specialties: Vascular Surgery, Cardiology Contact information: [address] [place] [address] [phone]                No Known Allergies  Consultations: Neurology Vascular surgery   Procedures/Studies: CT ANGIO HEAD W OR WO CONTRAST  Result Date: 08/19/2021 CLINICAL DATA:  Stroke/TIA, assess intracranial arteries Stroke/TIA, assess extracranial arteries EXAM: CT ANGIOGRAPHY HEAD AND NECK TECHNIQUE: Multidetector CT imaging of the head and neck was performed using the standard protocol during bolus administration of intravenous contrast. Multiplanar CT image reconstructions and MIPs were obtained to evaluate the vascular anatomy. Carotid stenosis measurements (when applicable) are obtained utilizing NASCET criteria, using the distal internal carotid diameter as the denominator. CONTRAST:  75mL OMNIPAQUE IOHEXOL 350 MG/ML SOLN COMPARISON:  None. FINDINGS: CT HEAD FINDINGS Brain: There is no mass, hemorrhage or extra-axial collection. The size and configuration of the ventricles and extra-axial CSF spaces are normal. There is no acute or chronic infarction. The  brain parenchyma is normal. Skull: The visualized skull base, calvarium and extracranial soft tissues are normal. Sinuses/Orbits: No fluid levels or advanced mucosal thickening of the visualized paranasal sinuses. No mastoid or middle ear effusion. The orbits are normal. CTA NECK FINDINGS SKELETON: There is no bony spinal canal stenosis. No lytic or blastic lesion. OTHER NECK: Normal pharynx, larynx and major salivary glands. No cervical lymphadenopathy. Unremarkable thyroid gland. UPPER CHEST: No pneumothorax or pleural effusion. No nodules or masses. AORTIC ARCH: There is calcific atherosclerosis of the aortic arch. There is no aneurysm, dissection or hemodynamically significant stenosis of the visualized portion of the aorta. Conventional 3 vessel aortic branching pattern. The visualized proximal subclavian arteries are widely patent. RIGHT CAROTID SYSTEM: No dissection, occlusion or aneurysm. There is mixed density atherosclerosis extending into the proximal ICA, resulting in 50% stenosis. There is severe stenosis of the distal right ICA. LEFT CAROTID SYSTEM: No dissection, occlusion or aneurysm. Mild atherosclerotic calcification at the carotid bifurcation without hemodynamically significant stenosis. VERTEBRAL ARTERIES: Left dominant configuration. Both origins are clearly patent. Mild calcification of the distal left V1 segment. Otherwise,  the V1-V3 segments are normal. CTA HEAD FINDINGS POSTERIOR CIRCULATION: --Vertebral arteries: Normal V4 segments. --Inferior cerebellar arteries: Normal. --Basilar artery: Normal. --Superior cerebellar arteries: Normal. --Posterior cerebral arteries (PCA): Normal. ANTERIOR CIRCULATION: --Intracranial internal carotid arteries: Skull base segments of the right ICA are occluded. There is calcific atherosclerosis of the left ICA. --Anterior cerebral arteries (ACA): Normal. Both A1 segments are present. Patent anterior communicating artery (a-comm). --Middle cerebral arteries  (MCA): Normal. VENOUS SINUSES: As permitted by contrast timing, patent. ANATOMIC VARIANTS: None Review of the MIP images confirms the above findings. IMPRESSION: 1. Right ICA occlusion at the skull base. Severe stenosis of the distal cervical right ICA. 2. Cerebral arteries are patent. 3. 50% stenosis of the proximal right internal carotid artery secondary to mixed density atherosclerosis. Aortic Atherosclerosis (ICD10-I70.0). Electronically Signed   By: Deatra Robinson M.D.   On: 08/19/2021 00:56   DG Chest 2 View  Result Date: 07/22/2021 CLINICAL DATA:  Chest pain.  Nausea and dizziness. EXAM: CHEST - 2 VIEW COMPARISON:  None. FINDINGS: The heart size is borderline to mildly enlarged. The hila and mediastinum are normal. No pneumothorax. No nodules or masses. Mild atelectasis in the left base. No suspicious infiltrate. No overt edema. IMPRESSION: No active cardiopulmonary disease. Electronically Signed   By: Gerome Sam III M.D   On: 07/22/2021 10:15   CT Head Wo Contrast  Result Date: 07/22/2021 CLINICAL DATA:  Nausea and dizziness for 2.5 weeks. Intermittent chest pain radiating to the back. No reported injury. EXAM: CT HEAD WITHOUT CONTRAST TECHNIQUE: Contiguous axial images were obtained from the base of the skull through the vertex without intravenous contrast. COMPARISON:  None. FINDINGS: Brain: No evidence of parenchymal hemorrhage or extra-axial fluid collection. No mass lesion, mass effect, or midline shift. No CT evidence of acute infarction. Cerebral volume is age appropriate. No ventriculomegaly. Vascular: No acute abnormality. Skull: No evidence of calvarial fracture. Sinuses/Orbits: The visualized paranasal sinuses are essentially clear. Other:  The mastoid air cells are unopacified. IMPRESSION: Negative head CT. No evidence of acute intracranial abnormality. Electronically Signed   By: Delbert Phenix M.D.   On: 07/22/2021 11:06   CT ANGIO NECK W OR WO CONTRAST  Result Date:  08/19/2021 CLINICAL DATA:  Stroke/TIA, assess intracranial arteries Stroke/TIA, assess extracranial arteries EXAM: CT ANGIOGRAPHY HEAD AND NECK TECHNIQUE: Multidetector CT imaging of the head and neck was performed using the standard protocol during bolus administration of intravenous contrast. Multiplanar CT image reconstructions and MIPs were obtained to evaluate the vascular anatomy. Carotid stenosis measurements (when applicable) are obtained utilizing NASCET criteria, using the distal internal carotid diameter as the denominator. CONTRAST:  8mL OMNIPAQUE IOHEXOL 350 MG/ML SOLN COMPARISON:  None. FINDINGS: CT HEAD FINDINGS Brain: There is no mass, hemorrhage or extra-axial collection. The size and configuration of the ventricles and extra-axial CSF spaces are normal. There is no acute or chronic infarction. The brain parenchyma is normal. Skull: The visualized skull base, calvarium and extracranial soft tissues are normal. Sinuses/Orbits: No fluid levels or advanced mucosal thickening of the visualized paranasal sinuses. No mastoid or middle ear effusion. The orbits are normal. CTA NECK FINDINGS SKELETON: There is no bony spinal canal stenosis. No lytic or blastic lesion. OTHER NECK: Normal pharynx, larynx and major salivary glands. No cervical lymphadenopathy. Unremarkable thyroid gland. UPPER CHEST: No pneumothorax or pleural effusion. No nodules or masses. AORTIC ARCH: There is calcific atherosclerosis of the aortic arch. There is no aneurysm, dissection or hemodynamically significant stenosis of the visualized portion of  the aorta. Conventional 3 vessel aortic branching pattern. The visualized proximal subclavian arteries are widely patent. RIGHT CAROTID SYSTEM: No dissection, occlusion or aneurysm. There is mixed density atherosclerosis extending into the proximal ICA, resulting in 50% stenosis. There is severe stenosis of the distal right ICA. LEFT CAROTID SYSTEM: No dissection, occlusion or aneurysm.  Mild atherosclerotic calcification at the carotid bifurcation without hemodynamically significant stenosis. VERTEBRAL ARTERIES: Left dominant configuration. Both origins are clearly patent. Mild calcification of the distal left V1 segment. Otherwise, the V1-V3 segments are normal. CTA HEAD FINDINGS POSTERIOR CIRCULATION: --Vertebral arteries: Normal V4 segments. --Inferior cerebellar arteries: Normal. --Basilar artery: Normal. --Superior cerebellar arteries: Normal. --Posterior cerebral arteries (PCA): Normal. ANTERIOR CIRCULATION: --Intracranial internal carotid arteries: Skull base segments of the right ICA are occluded. There is calcific atherosclerosis of the left ICA. --Anterior cerebral arteries (ACA): Normal. Both A1 segments are present. Patent anterior communicating artery (a-comm). --Middle cerebral arteries (MCA): Normal. VENOUS SINUSES: As permitted by contrast timing, patent. ANATOMIC VARIANTS: None Review of the MIP images confirms the above findings. IMPRESSION: 1. Right ICA occlusion at the skull base. Severe stenosis of the distal cervical right ICA. 2. Cerebral arteries are patent. 3. 50% stenosis of the proximal right internal carotid artery secondary to mixed density atherosclerosis. Aortic Atherosclerosis (ICD10-I70.0). Electronically Signed   By: Deatra Robinson M.D.   On: 08/19/2021 00:56   CT CORONARY MORPH W/CTA COR W/SCORE W/CA W/CM &/OR WO/CM  Addendum Date: 08/03/2021   ADDENDUM REPORT: 08/03/2021 19:09 CLINICAL DATA:  Chest pain EXAM: Cardiac/Coronary CTA TECHNIQUE: A non-contrast, gated CT scan was obtained with axial slices of 3 mm through the heart for calcium scoring. Calcium scoring was performed using the Agatston method. A 100 kV prospective, gated, contrast cardiac scan was obtained. Gantry rotation speed was 250 msecs and collimation was 0.6 mm. Two sublingual nitroglycerin tablets (0.8 mg) were given. The 3D data set was reconstructed in 5% intervals of the 35-75% of the R-R  cycle. Diastolic phases were analyzed on a dedicated workstation using MPR, MIP, and VRT modes. The patient received 95 cc of contrast. FINDINGS: Image quality: Excellent. Noise artifact is: Limited. Coronary Arteries:  Normal coronary origin.  Right dominance. Left main: The left main is a large caliber vessel with a normal take off from the left coronary cusp that bifurcates to form a left anterior descending artery and a left circumflex artery. There is no plaque or stenosis. Left anterior descending artery: There is minimal non-calcified plaque (<25%) in the mid LAD. There is minimal non-calcified plaque in the first diagonal (<25%). The second diagonal is patent. Left circumflex artery: The LCX is non-dominant and patent with no evidence of plaque or stenosis. OM1 contains a moderate non-calcified plaque (50-69%). The second OM branch is patent. Right coronary artery: The RCA is dominant with normal take off from the right coronary cusp. The proximal RCA contains minimal calcified plaque (<25%). The mid RCA appears occluded (100%), with flow distal suspicious for chronic total occlusion as the PDA fills with contrast. Right Atrium: Right atrial size is within normal limits. Right Ventricle: The right ventricular cavity is within normal limits. Left Atrium: Left atrial size is normal in size with no left atrial appendage filling defect. Left Ventricle: The ventricular cavity size is within normal limits. There are no stigmata of prior infarction. There is no abnormal filling defect. Pulmonary arteries: Normal in size without proximal filling defect. Pulmonary veins: Normal pulmonary venous drainage. Pericardium: Normal thickness with no significant effusion or calcium  present. Cardiac valves: The aortic valve is trileaflet without significant calcification. The mitral valve is normal structure without significant calcification. Aorta: Normal caliber with no significant disease. Extra-cardiac findings: See  attached radiology report for non-cardiac structures. IMPRESSION: 1. Coronary calcium score of 62.8. This was 87th percentile for age-, sex, and race-matched controls. 2. Normal coronary origin with right dominance. 3. Minimal CAD (<25%) in the LAD. 4. OM1 contains a moderate non-calcified plaque (50-69%). 5. The mid RCA appears occluded (100%) with contrast in the distal RCA suspicious for chronic total occlusion with collateral flow. RECOMMENDATIONS: 1. Moderate stenosis in OM1 and concerns for total coronary occlusion of the mid RCA. Cardiac catheterization is recommended. CT FFR will be submitted for the OM1. Consider symptom-guided anti-ischemic pharmacotherapy as well as risk factor modification per guideline directed care. Lennie Odor, MD Electronically Signed   By: Lennie Odor   On: 08/03/2021 19:09   Result Date: 08/03/2021 EXAM: OVER-READ INTERPRETATION  CT CHEST The following report is an over-read performed by radiologist Dr. Jeronimo Greaves of [facility] on 08/03/2021. This over-read does not include interpretation of cardiac or coronary anatomy or pathology. The coronary CTA interpretation by the cardiologist is attached. COMPARISON:  07/22/2021 chest radiograph. FINDINGS: Vascular: Aortic atherosclerosis. No central pulmonary embolism, on this non-dedicated study. Mediastinum/Nodes: No imaged thoracic adenopathy. Lungs/Pleura: No pleural fluid.  Clear imaged lungs. Upper Abdomen: Normal imaged portions of the liver. 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 Left breast implant is incompletely imaged. No acute osseous abnormality. IMPRESSION: 1. No acute findings in the imaged extracardiac chest. 2. Aortic Atherosclerosis (ICD10-I70.0). Electronically Signed: By: Jeronimo Greaves M.D. On: 08/03/2021 14:51   CT CORONARY FRACTIONAL FLOW RESERVE DATA PREP  Result Date: 08/04/2021 EXAM: CT FFR analysis was performed on the original cardiac CTA dataset. Diagrammatic  representation of the CT FFR analysis is provided in a separate PDF document in PACS. This dictation was created using the PDF document and an interactive 3D model of the results. The 3D model is not available in the EMR/PACS. INTERPRETATION: CT FFR provides simultaneous calculation of pressure and flow across the entire coronary tree. For clinical decision making, CT FFR values should be obtained 1-2 cm distal to the lower border of each stenosis measured. Coronary CTA-related artifacts may impair the diagnostic accuracy of the original cardiac CTA and FFR CT results. *Due to the fact that CT FFR represents a mathematically-derived analysis, it is recommended that the results be interpreted as follows: *CT FFR >0.80: Low likelihood of hemodynamic significance. *CT FFR 0.76-0.80: Borderline likelihood of hemodynamic significance. *CT FFR =< 0.75: High likelihood of hemodynamic significance. *Coronary CT Angiography-derived Fractional Flow Reserve Testing in Patients with Stable Coronary Artery Disease: Recommendations on Interpretation and Reporting. Radiology: Cardiothoracic Imaging. 2019;1(5):[id number] FINDINGS: 1. Left Main: 0.96; no significant stenosis. 2. Proximal LAD: 0.94; no significant stenosis. 3. Mid LAD: 0.82; no significant stenosis. 4. D1: 0.83; no significant stenosis. 5. LCX: 0.91 no significant stenosis. 6. OM1: 0.86; no significant stenosis. 7. RCA: Not modeled as occluded (100%). IMPRESSION: 1.  No significant stenoses by CT FFR. 2.  OM2 negative (CT FFR = 0.86). 3.  RCA not modeled as occluded (100%). Lennie Odor, MD Electronically Signed   By: Lennie Odor   On: 08/04/2021 07:54   ECHOCARDIOGRAM COMPLETE  Result Date: 08/07/2021    ECHOCARDIOGRAM REPORT   Patient Name:   Samantha Russo Date of Exam: 08/07/2021 Medical Rec #:  [id number]  Height:       64.0 in Accession #:    [id number]         Weight:       185.0 lb Date of Birth:  11-21-1961         BSA:          1.893 m Patient  Age:    59 years           BP:           156/74 mmHg Patient Gender: F                  HR:           92 bpm. Exam Location:  [address] Procedure: 2D Echo, Cardiac Doppler and Color Doppler Indications:    R07.9 Chest pain  History:        Patient has no prior history of Echocardiogram examinations.                 Signs/Symptoms:Chest Pain and Dizziness; Risk                 Factors:Hypertension and Current Smoker.  Sonographer:    Samule Ohm RDCS Referring Phys: [id number] Georgeanna Lea  Sonographer Comments: Technically difficult study due to poor echo windows. IMPRESSIONS  1. Left ventricular ejection fraction, by estimation, is 55 to 60%. The left ventricle has normal function. The left ventricle has no regional wall motion abnormalities. There is moderate left ventricular hypertrophy. Left ventricular diastolic parameters were normal.  2. Right ventricular systolic function is normal. The right ventricular size is normal. Tricuspid regurgitation signal is inadequate for assessing PA pressure.  3. A small pericardial effusion is present.  4. The mitral valve is normal in structure. No evidence of mitral valve regurgitation. No evidence of mitral stenosis.  5. The aortic valve was not well visualized. Aortic valve regurgitation is not visualized. No aortic stenosis is present.  6. The inferior vena cava is normal in size with greater than 50% respiratory variability, suggesting right atrial pressure of 3 mmHg. FINDINGS  Left Ventricle: Left ventricular ejection fraction, by estimation, is 55 to 60%. The left ventricle has normal function. The left ventricle has no regional wall motion abnormalities. The left ventricular internal cavity size was normal in size. There is  moderate left ventricular hypertrophy. Left ventricular diastolic parameters were normal. Right Ventricle: The right ventricular size is normal. No increase in right ventricular wall thickness. Right ventricular systolic function is  normal. Tricuspid regurgitation signal is inadequate for assessing PA pressure. Left Atrium: Left atrial size was normal in size. Right Atrium: Right atrial size was normal in size. Pericardium: A small pericardial effusion is present. Mitral Valve: The mitral valve is normal in structure. No evidence of mitral valve regurgitation. No evidence of mitral valve stenosis. Tricuspid Valve: The tricuspid valve is normal in structure. Tricuspid valve regurgitation is trivial. Aortic Valve: The aortic valve was not well visualized. Aortic valve regurgitation is not visualized. No aortic stenosis is present. Pulmonic Valve: The pulmonic valve was not well visualized. Pulmonic valve regurgitation is not visualized. Aorta: The aortic root and ascending aorta are structurally normal, with no evidence of dilitation. Venous: The inferior vena cava is normal in size with greater than 50% respiratory variability, suggesting right atrial pressure of 3 mmHg. IAS/Shunts: No atrial level shunt detected by color flow Doppler.  LEFT VENTRICLE PLAX 2D LVIDd:         4.10 cm  Diastology LVIDs:         2.90 cm  LV e' medial:    7.07 cm/s LV PW:         1.20 cm  LV E/e' medial:  11.0 LV IVS:        1.30 cm  LV e' lateral:   7.07 cm/s LVOT diam:     1.90 cm  LV E/e' lateral: 11.0 LV SV:         55 LV SV Index:   29 LVOT Area:     2.84 cm  RIGHT VENTRICLE             IVC RV S prime:     10.30 cm/s  IVC diam: 0.90 cm TAPSE (M-mode): 1.5 cm LEFT ATRIUM             Index       RIGHT ATRIUM           Index LA diam:        3.20 cm 1.69 cm/m  RA Pressure: 3.00 mmHg LA Vol (A2C):   27.1 ml 14.32 ml/m RA Area:     8.83 cm LA Vol (A4C):   43.8 ml 23.14 ml/m RA Volume:   15.70 ml  8.30 ml/m LA Biplane Vol: 37.4 ml 19.76 ml/m  AORTIC VALVE LVOT Vmax:   94.00 cm/s LVOT Vmean:  65.600 cm/s LVOT VTI:    0.194 m  AORTA Ao Root diam: 2.70 cm Ao Asc diam:  3.10 cm MITRAL VALVE               TRICUSPID VALVE MV Area (PHT): 3.97 cm    Estimated RAP:   3.00 mmHg MV Decel Time: 191 msec MV E velocity: 78.00 cm/s  SHUNTS MV A velocity: 87.40 cm/s  Systemic VTI:  0.19 m MV E/A ratio:  0.89        Systemic Diam: 1.90 cm Epifanio Lescheshristopher Schumann MD Electronically signed by Epifanio Lescheshristopher Schumann MD Signature Date/Time: 08/07/2021/8:21:14 PM    Final    VAS US CAROTID  Result Date: 08/18/2021 Carotid Arterial Duplex Study Patient Name:  Samantha HaddockJACQUELINE Russo  Date of Exam:   08/18/2021 Medical Rec #: [id number]           Accession #:    [id number] Date of Birth: Mar 28, 1961          Patient Gender: F Patient Age:   [age] years Exam Location:  [facility] Procedure:      VAS US CAROTID Referring Phys: Gypsy BalsamOBERT KRASOWSKI --------------------------------------------------------------------------------  Indications:                            Visual disturbance and Patient has had                                         constant dizziness since 07/07/2021.                                         Patient has had 2 episodes of complete                                         blindness and  lasted a few seconds.                                         Patient denies any other                                         cerebrovascular symptoms. Risk Factors:                           Hypertension, hyperlipidemia, current                                         smoker, coronary artery disease. Comparison Study:                       None Pre-Surgical Evaluation & Surgical      High resistant low flow seen in the Correlation                             proximal CCA; could there be a more                                         proximal stenosis? ICA is normal past                                         the stenosis. Anatomy on the right is                                         within normal limits.Right bifurcation                                         is located near the [place]. Performing Technologist: Carlos American RVT, RDCS (AE), RDMS  Examination Guidelines: A complete evaluation  includes B-mode imaging, spectral Doppler, color Doppler, and power Doppler as needed of all accessible portions of each vessel. Bilateral testing is considered an integral part of a complete examination. Limited examinations for reoccurring indications may be performed as noted.  Right Carotid Findings: +----------+--------+--------+--------+---------------------+------------------+           PSV cm/sEDV cm/sStenosisPlaque Description   Comments           +----------+--------+--------+--------+---------------------+------------------+ CCA Prox  27      6                                    high resistant  flow               +----------+--------+--------+--------+---------------------+------------------+ CCA Mid   17      7                                                       +----------+--------+--------+--------+---------------------+------------------+ CCA Distal25      12                                   intimal thickening +----------+--------+--------+--------+---------------------+------------------+ ICA Prox  683     358     80-99%  heterogenous and                                                          irregular                               +----------+--------+--------+--------+---------------------+------------------+ ICA Mid   29      14                                                      +----------+--------+--------+--------+---------------------+------------------+ ICA Distal45      22                                                      +----------+--------+--------+--------+---------------------+------------------+ ECA       203     52      >50%                                            +----------+--------+--------+--------+---------------------+------------------+ +----------+--------+-------+---------+-------------------+           PSV cm/sEDV cmsDescribe Arm  Pressure (mmHG) +----------+--------+-------+---------+-------------------+ [id number]            [id number]                 +----------+--------+-------+---------+-------------------+ +---------+--------+--+--------+--+---------+ VertebralPSV cm/s70EDV cm/s31Antegrade +---------+--------+--+--------+--+---------+  Left Carotid Findings: +----------+--------+--------+--------+--------------------+-------------------+           PSV cm/sEDV cm/sStenosisPlaque Description  Comments            +----------+--------+--------+--------+--------------------+-------------------+ CCA Prox  124     42                                                      +----------+--------+--------+--------+--------------------+-------------------+ CCA Mid   81      35              homogeneous and  smooth                                  +----------+--------+--------+--------+--------------------+-------------------+ CCA Distal105     46      <50%    homogeneous and     Hypoechoic plaque                                     smooth              measuring .36cm AP  +----------+--------+--------+--------+--------------------+-------------------+ ICA Prox  119     45      40-59%                                          +----------+--------+--------+--------+--------------------+-------------------+ ICA Mid   129     48      40-59%                      tortuous            +----------+--------+--------+--------+--------------------+-------------------+ ICA Distal64      24                                                      +----------+--------+--------+--------+--------------------+-------------------+ ECA       267     82      >50%                                            +----------+--------+--------+--------+--------------------+-------------------+  +----------+--------+--------+----------------+-------------------+           PSV cm/sEDV cm/sDescribe        Arm Pressure (mmHG) +----------+--------+--------+----------------+-------------------+ [id number]             Multiphasic, [id number]                 +----------+--------+--------+----------------+-------------------+ +---------+--------+--+--------+--+---------+ VertebralPSV cm/s89EDV cm/s35Antegrade +---------+--------+--+--------+--+---------+    Findings reported to Dr. Flora Lipps (DOD) at 4:00 pm. Patient sent to ER with husband. . Summary: Right Carotid: Velocities in the right ICA are consistent with a 80-99%                stenosis.                 Low flow in the CCA with a question of a more proximal stenosis. Left Carotid: Velocities in the left ICA are consistent with a 40-59% stenosis.               Non-hemodynamically significant plaque <50% noted in the CCA. Vertebrals:  Bilateral vertebral arteries demonstrate antegrade flow. Subclavians: Right subclavian artery flow was disturbed. Normal flow              hemodynamics were seen in the left subclavian artery. *See table(s) above for measurements and observations.  Vascular consult recommended. Electronically signed by Julien Nordmann MD on 08/18/2021 at 4:30:55 PM.    Final       Subjective: Patient was seen and examined at bedside.  Overnight events noted.  Patient reports feeling much better and wants to be discharged.  Patient cleared from vascular surgery and neurology.  Patient was seen by neurology nurse practitioner, she did not want to wait for neurologist.  Patient was discharged and she left.  Discharge Exam: Vitals:   08/19/21 1550 08/19/21 1600  BP: (!) 144/98 (!) 169/96  Pulse: 97 93  Resp: 20   Temp:    SpO2: 96% 98%   Vitals:   08/19/21 1300 08/19/21 1400 08/19/21 1550 08/19/21 1600  BP: (!) 143/107 (!) 140/99 (!) 144/98 (!) 169/96  Pulse: 89 90 97 93  Resp:   20   Temp:      TempSrc:       SpO2: 96% 95% 96% 98%  Weight:      Height:        General: Pt is alert, awake, not in acute distress Cardiovascular: RRR, S1/S2 +, no rubs, no gallops Respiratory: CTA bilaterally, no wheezing, no rhonchi Abdominal: Soft, NT, ND, bowel sounds + Extremities: no edema, no cyanosis    The results of significant diagnostics from this hospitalization (including imaging, microbiology, ancillary and laboratory) are listed below for reference.     Microbiology: Recent Results (from the past 240 hour(s))  Resp Panel by RT-PCR (Flu A&B, Covid) Nasopharyngeal Swab     Status: None   Collection Time: 08/18/21  6:58 PM   Specimen: Nasopharyngeal Swab; Nasopharyngeal(NP) swabs in vial transport medium  Result Value Ref Range Status   SARS Coronavirus 2 by RT PCR NEGATIVE NEGATIVE Final    Comment: (NOTE) SARS-CoV-2 target nucleic acids are NOT DETECTED.  The SARS-CoV-2 RNA is generally detectable in upper respiratory specimens during the acute phase of infection. The lowest concentration of SARS-CoV-2 viral copies this assay can detect is 138 copies/mL. A negative result does not preclude SARS-Cov-2 infection and should not be used as the sole basis for treatment or other patient management decisions. A negative result may occur with  improper specimen collection/handling, submission of specimen other than nasopharyngeal swab, presence of viral mutation(s) within the areas targeted by this assay, and inadequate number of viral copies(<138 copies/mL). A negative result must be combined with clinical observations, patient history, and epidemiological information. The expected result is Negative.  Fact Sheet for Patients:  BloggerCourse.com  Fact Sheet for Healthcare Providers:  SeriousBroker.it  This test is no t yet approved or cleared by the [place] FDA and  has been authorized for detection and/or diagnosis of SARS-CoV-2  by FDA under an Emergency Use Authorization (EUA). This EUA will remain  in effect (meaning this test can be used) for the duration of the COVID-19 declaration under Section 564(b)(1) of the Act, 21 U.S.C.section 360bbb-3(b)(1), unless the authorization is terminated  or revoked sooner.       Influenza A by PCR NEGATIVE NEGATIVE Final   Influenza B by PCR NEGATIVE NEGATIVE Final    Comment: (NOTE) The Xpert Xpress SARS-CoV-2/FLU/RSV plus assay is intended as an aid in the diagnosis of influenza from Nasopharyngeal swab specimens and should not be used as a sole basis for treatment. Nasal washings and aspirates are unacceptable for Xpert Xpress SARS-CoV-2/FLU/RSV testing.  Fact Sheet for Patients: BloggerCourse.com  Fact Sheet for Healthcare Providers: SeriousBroker.it  This test is not yet approved or cleared by the [place] FDA and has been authorized for detection and/or diagnosis of SARS-CoV-2 by FDA under an Emergency Use Authorization (EUA). This EUA will remain in effect (meaning this test  can be used) for the duration of the COVID-19 declaration under Section 564(b)(1) of the Act, 21 U.S.C. section 360bbb-3(b)(1), unless the authorization is terminated or revoked.  Performed at [facility], [address] [place], [place] [address]      Labs: BNP (last 3 results) No results for input(s): BNP in the last 8760 hours. Basic Metabolic Panel: Recent Labs  Lab 08/18/21 1748 08/19/21 0249  NA 131* 135  K 4.0 3.5  CL 101 102  CO2 21* 24  GLUCOSE 83 109*  BUN 12 10  CREATININE 0.56 0.57  CALCIUM 9.2 9.1   Liver Function Tests: Recent Labs  Lab 08/18/21 1748  AST 26  ALT 32  ALKPHOS 91  BILITOT 0.3  PROT 7.1  ALBUMIN 3.8   No results for input(s): LIPASE, AMYLASE in the last 168 hours. No results for input(s): AMMONIA in the last 168 hours. CBC: Recent Labs  Lab 08/18/21 1748  08/19/21 0250  WBC 10.1 10.1  NEUTROABS 6.3  --   HGB 14.2 13.5  HCT 43.2 40.3  MCV 90.9 90.4  PLT 349 312   Cardiac Enzymes: No results for input(s): CKTOTAL, CKMB, CKMBINDEX, TROPONINI in the last 168 hours. BNP: Invalid input(s): POCBNP CBG: No results for input(s): GLUCAP in the last 168 hours. D-Dimer No results for input(s): DDIMER in the last 72 hours. Hgb A1c No results for input(s): HGBA1C in the last 72 hours. Lipid Profile No results for input(s): CHOL, HDL, LDLCALC, TRIG, CHOLHDL, LDLDIRECT in the last 72 hours. Thyroid function studies No results for input(s): TSH, T4TOTAL, T3FREE, THYROIDAB in the last 72 hours.  Invalid input(s): FREET3 Anemia work up No results for input(s): VITAMINB12, FOLATE, FERRITIN, TIBC, IRON, RETICCTPCT in the last 72 hours. Urinalysis    Component Value Date/Time   COLORURINE YELLOW 08/19/2021 0950   APPEARANCEUR CLEAR 08/19/2021 0950   LABSPEC 1.018 08/19/2021 0950   PHURINE 6.0 08/19/2021 0950   GLUCOSEU NEGATIVE 08/19/2021 0950   HGBUR SMALL (A) 08/19/2021 0950   BILIRUBINUR NEGATIVE 08/19/2021 0950   KETONESUR NEGATIVE 08/19/2021 0950   PROTEINUR NEGATIVE 08/19/2021 0950   NITRITE NEGATIVE 08/19/2021 0950   LEUKOCYTESUR NEGATIVE 08/19/2021 0950   Sepsis Labs Invalid input(s): PROCALCITONIN,  WBC,  LACTICIDVEN Microbiology Recent Results (from the past 240 hour(s))  Resp Panel by RT-PCR (Flu A&B, Covid) Nasopharyngeal Swab     Status: None   Collection Time: 08/18/21  6:58 PM   Specimen: Nasopharyngeal Swab; Nasopharyngeal(NP) swabs in vial transport medium  Result Value Ref Range Status   SARS Coronavirus 2 by RT PCR NEGATIVE NEGATIVE Final    Comment: (NOTE) SARS-CoV-2 target nucleic acids are NOT DETECTED.  The SARS-CoV-2 RNA is generally detectable in upper respiratory specimens during the acute phase of infection. The lowest concentration of SARS-CoV-2 viral copies this assay can detect is 138 copies/mL. A  negative result does not preclude SARS-Cov-2 infection and should not be used as the sole basis for treatment or other patient management decisions. A negative result may occur with  improper specimen collection/handling, submission of specimen other than nasopharyngeal swab, presence of viral mutation(s) within the areas targeted by this assay, and inadequate number of viral copies(<138 copies/mL). A negative result must be combined with clinical observations, patient history, and epidemiological information. The expected result is Negative.  Fact Sheet for Patients:  BloggerCourse.com  Fact Sheet for Healthcare Providers:  SeriousBroker.it  This test is no t yet approved or cleared by the [place] and  has been authorized for detection and/or diagnosis of SARS-CoV-2 by FDA under an Emergency Use Authorization (EUA). This EUA will remain  in effect (meaning this test can be used) for the duration of the COVID-19 declaration under Section 564(b)(1) of the Act, 21 U.S.C.section 360bbb-3(b)(1), unless the authorization is terminated  or revoked sooner.       Influenza A by PCR NEGATIVE NEGATIVE Final   Influenza B by PCR NEGATIVE NEGATIVE Final    Comment: (NOTE) The Xpert Xpress SARS-CoV-2/FLU/RSV plus assay is intended as an aid in the diagnosis of influenza from Nasopharyngeal swab specimens and should not be used as a sole basis for treatment. Nasal washings and aspirates are unacceptable for Xpert Xpress SARS-CoV-2/FLU/RSV testing.  Fact Sheet for Patients: BloggerCourse.com  Fact Sheet for Healthcare Providers: SeriousBroker.it  This test is not yet approved or cleared by the [place] FDA and has been authorized for detection and/or diagnosis of SARS-CoV-2 by FDA under an Emergency Use Authorization (EUA). This EUA will remain in effect (meaning this test can  be used) for the duration of the COVID-19 declaration under Section 564(b)(1) of the Act, 21 U.S.C. section 360bbb-3(b)(1), unless the authorization is terminated or revoked.  Performed at [facility], [address] [place], [place] [address]      Time coordinating discharge: Over 30 minutes  SIGNED:   Cipriano Bunker, MD  [facility] 08/19/2021, 5:02 PM Pager   If 7PM-7AM, please contact night-coverage

## 2021-08-19 NOTE — ED Notes (Signed)
Breakfast Ordered 

## 2021-08-19 NOTE — Consult Note (Signed)
Neurology Consultation  Reason for Consult: Amaurosis Fugax OD.  Referring Physician: Casper Harrison, MD.   CC: loss of vision OD.   History is obtained from: patient, chart.   HPI: Samantha Russo is a 60 y.o. female with a PMHx of CAD s/p MI in July 2022, occlusion of RCA but with collaterals, severe right carotid stenosis, tobacco abuse, HTN, and HLD. Patient presented to ED 08/18/21 with acute vision loss OD with history of dizziness for one month. Described as acute blackness to OD which resolved instantaneously. Patient with known RICA stenosis and 2 episodes of acute vision loss OD in past 3 weeks. She has no blurred, black, double vision at this time. Vision is "back to normal". She was started on ASA 81mg  po qd and Crestor 20mg  in July-LDL was 198.   Vascular on 08/18/21 showed RICA 80-99% stenosis and LICA 40-59%. Right and left VA with antegrade flow. Patient was asked to come to the ED from cardiology office. Vascular surgery was consulted from ED.   Had an echocardiogram 08/07/21 showing left ventricle hypertrophy but no wall motion abnormalities. Coronary CT angiogram showed completely occluded right coronary artery. Last saw cardiology out patient on 08/08/21. Recommendation were for possible trans carotid stenting, but only after better BP control and cardiology clearance.   Patient admits to recent weight gain and eating a lot of carbohydrates. She is down to one cigarette per day now. States she wants to live and is committed to changing her lifestyle.   Neurology was asked to consult for amaurosis fugax OD.   ROS: A robust ROS was performed and is negative except as noted in the HPI.    Past Medical History:  Diagnosis Date   Atypical chest pain 07/25/2021   Cervical disc disorder with radiculopathy 08/09/2015   Chronic neck and back pain    Hypertension    Smoking 07/25/2021  MI, Known RICA stenosis, known coronary stenosis RCA.   Family History  Problem Relation Age of Onset    Heart failure Father    Diabetes Father     Social History:   reports that she has been smoking cigarettes. She has been smoking an average of .1 packs per day. She has never used smokeless tobacco. She reports current alcohol use. She reports that she does not use drugs.  Medications  Current Facility-Administered Medications:    acetaminophen (TYLENOL) tablet 650 mg, 650 mg, Oral, Q6H PRN **OR** acetaminophen (TYLENOL) suppository 650 mg, 650 mg, Rectal, Q6H PRN, 10/09/2015, Jared M, DO   aspirin EC tablet 81 mg, 81 mg, Oral, Daily, Julian Reil, Jared M, DO, 81 mg at 08/19/21 09-16-1999   clopidogrel (PLAVIX) tablet 75 mg, 75 mg, Oral, Daily, 08/21/21, MD, 75 mg at 08/19/21 Cipriano Bunker   diazepam (VALIUM) tablet 5 mg, 5 mg, Oral, Daily PRN, 08/21/21, Jared M, DO   nebivolol (BYSTOLIC) tablet 5 mg, 5 mg, Oral, Daily, Julian Reil, MD, 5 mg at 08/19/21 1644   ondansetron (ZOFRAN) tablet 4 mg, 4 mg, Oral, Q6H PRN **OR** ondansetron (ZOFRAN) injection 4 mg, 4 mg, Intravenous, Q6H PRN, Cipriano Bunker, Jared M, DO   rosuvastatin (CRESTOR) tablet 20 mg, 20 mg, Oral, Daily, Julian Reil, Jared M, DO, 20 mg at 08/19/21 M   spironolactone (ALDACTONE) tablet 25 mg, 25 mg, Oral, Daily, 08/21/21, MD, 25 mg at 08/19/21 1644  Current Outpatient Medications:    aspirin EC 81 MG tablet, Take 1 tablet (81 mg total) by mouth daily. Swallow whole., Disp: 90 tablet, Rfl:  3   diazepam (VALIUM) 10 MG tablet, Take 5 mg by mouth daily as needed for anxiety., Disp: , Rfl:    nebivolol (BYSTOLIC) 5 MG tablet, Take 1 tablet (5 mg total) by mouth daily., Disp: 90 tablet, Rfl: 1   nitroGLYCERIN (NITROLINGUAL) 0.4 MG/SPRAY spray, Place 1 spray under the tongue every 5 (five) minutes x 3 doses as needed for chest pain (Do not exceed 3 times in a day and Call 911 if chest pain continues or worsening)., Disp: 12 g, Rfl: 12   spironolactone (ALDACTONE) 25 MG tablet, Take 1 tablet (25 mg total) by mouth daily., Disp: 90 tablet, Rfl: 1    Vitamin D, Ergocalciferol, (DRISDOL) 1.25 MG (50000 UNIT) CAPS capsule, Take 50,000 Units by mouth every 3 (three) days., Disp: , Rfl:    [START ON 08/20/2021] clopidogrel (PLAVIX) 75 MG tablet, Take 1 tablet (75 mg total) by mouth daily., Disp: 90 tablet, Rfl: 0   rosuvastatin (CRESTOR) 40 MG tablet, Take 1 tablet (40 mg total) by mouth daily., Disp: 90 tablet, Rfl: 3   Exam: Current vital signs: BP (!) 145/99 (BP Location: Right Arm)   Pulse 88   Temp 98.1 F (36.7 C) (Oral)   Resp 16   Ht 5\' 3"  (1.6 m)   Wt 84 kg   LMP  (LMP Unknown)   SpO2 98%   BMI 32.80 kg/m  Vital signs in last 24 hours: Temp:  [98.1 F (36.7 C)-98.3 F (36.8 C)] 98.1 F (36.7 C) (08/20 0711) Pulse Rate:  [78-106] 88 (08/20 1710) Resp:  [10-20] 16 (08/20 1710) BP: (115-183)/(81-118) 145/99 (08/20 1710) SpO2:  [94 %-99 %] 98 % (08/20 1710)  PE: GENERAL:Well appearing. WDWN female resting on ED bed. Awake, alert in NAD.  HEENT: normocephalic and atraumatic. LUNGS - Normal respiratory effort.  CV - RRR on tele. ABDOMEN - Soft, nontender. Ext: warm, well perfused. Psych: affect light, but concerned.   NEURO:  Mental Status: Alert and oriented x 4.  Speech/Language: speech is without dysarthria or aphasia.  Naming, repetition, fluency, and comprehension intact.  Cranial Nerves:  II: PERRL 41mm/brisk. visual fields full.  III, IV, VI: EOMI. Lid elevation symmetric and full.  V: sensation is intact and symmetrical to face.  VII: Smile is symmetrical. Able to puff cheeks and raise eyebrows.  VIII: hearing intact to voice. IX, X: palate elevation is symmetric. Phonation normal.  XI: normal sternocleidomastoid and trapezius muscle strength. 1m is symmetrical without fasciculations.   Motor:  Strength is 5/5 throughout.  Sensation- Intact to light touch bilaterally in all four extremities. Extinction absent to DSS.  Coordination: FTN intact bilaterally.No pronator drift.   Vision: She is able  to read NP's name badge.   NIHSS: 0  Labs I have reviewed labs in epic and the results pertinent to this consultation are:  CBC    Component Value Date/Time   WBC 10.1 08/19/2021 0250   RBC 4.46 08/19/2021 0250   HGB 13.5 08/19/2021 0250   HCT 40.3 08/19/2021 0250   PLT 312 08/19/2021 0250   MCV 90.4 08/19/2021 0250   MCH 30.3 08/19/2021 0250   MCHC 33.5 08/19/2021 0250   RDW 14.5 08/19/2021 0250   LYMPHSABS 2.7 08/18/2021 1748   MONOABS 0.9 08/18/2021 1748   EOSABS 0.1 08/18/2021 1748   BASOSABS 0.1 08/18/2021 1748    CMP     Component Value Date/Time   NA 135 08/19/2021 0249   NA 138 07/25/2021 1224   K 3.5 08/19/2021  0249   CL 102 08/19/2021 0249   CO2 24 08/19/2021 0249   GLUCOSE 109 (H) 08/19/2021 0249   BUN 10 08/19/2021 0249   BUN 14 07/25/2021 1224   CREATININE 0.57 08/19/2021 0249   CALCIUM 9.1 08/19/2021 0249   PROT 7.1 08/18/2021 1748   ALBUMIN 3.8 08/18/2021 1748   AST 26 08/18/2021 1748   ALT 32 08/18/2021 1748   ALKPHOS 91 08/18/2021 1748   BILITOT 0.3 08/18/2021 1748   GFRNONAA >60 08/19/2021 0249    CTA head and neck. Right ICA occlusion at the skull base. Severe stenosis of the distal cervical right ICA. Cerebral arteries are patent. 50% stenosis of the proximal right internal carotid artery secondary to mixed density atherosclerosis.  Assessment: 61 yo female who presented on advice from her vascular MD for carotid stenosis found on [place] in office and for amaurosis fugax OD. Due to known RICA stenosis, this is most likely the reason for her symptoms-transient hypoperfusion in setting of occluded RICA. Has had an echo, [place], CTA head and neck and a MRI is not likely to yield much given the known issues. Patient is in a hurry to leave. NP discussed plan with attending and told MD that Dr. Amada Jupiter had to see patient prior to discharge, but patient chose to leave without seeing Neurologist.   Impression: -Recurrent amaurosis fugax OD due to  transient hypoperfusion to retina in setting of known RICA stenosis.  -Known RICA stenosis up to 99%.  -Obstructed coronary artery s/p MI July 2022.  -HLD. -HTN, uncontrolled.   Recommendations/Plan:  -Continue Plavix 75mg  po qd.  -Continue ASA 81mg  po qd.  -Given very high LDL just a few weeks ago, increase Crestor to 40mg  po qd.  -Avoid hypotension.  -f/up vascular surgery who will likely plan operative intervention when patient is cleared from cardiology stand point.   Patient educated on medications and the importance of taking them as prescribed. Tobacco cessation discussed as she is already down to 1 cigarette a day. Mediterranean diet recommended. Avoid fast food, fried food, carbohydrates.   Pt seen by , NP/Neuro. Patient left hospital before being seen by neurologist. Discussed case, plan and findings with MD.   Pager: 

## 2021-08-19 NOTE — ED Notes (Signed)
Hospitalist at bedside 

## 2021-08-19 NOTE — ED Notes (Signed)
Pt took bystolic and aldactone doses from her own personal medication supply.

## 2021-08-19 NOTE — ED Notes (Signed)
This RN went in to see pt and help set pt up for breakfast. Pt was not happy with what was sent and stated "She did not know how anyone could eat that, I am not eating that please throw it away." Pt then asked this RN "When she could see a dr that she wanted to sign herself out today and wanted to know when she could get that process started. This RN stated she would page the admitting dr and let her know. Will continue to monitor.

## 2021-08-22 ENCOUNTER — Telehealth: Payer: Self-pay | Admitting: Cardiology

## 2021-08-22 NOTE — Telephone Encounter (Signed)
Pt was discharged from the hospital on Sunday and would like to know what Dr. Bing Matter recommends for her in regards to care moving forward.

## 2021-08-22 NOTE — Telephone Encounter (Signed)
Appointment made with Dr. Kirtland Bouchard for hospital follow up.

## 2021-08-23 ENCOUNTER — Ambulatory Visit (INDEPENDENT_AMBULATORY_CARE_PROVIDER_SITE_OTHER): Payer: Managed Care, Other (non HMO) | Admitting: Cardiology

## 2021-08-23 ENCOUNTER — Encounter: Payer: Self-pay | Admitting: Cardiology

## 2021-08-23 ENCOUNTER — Other Ambulatory Visit: Payer: Self-pay

## 2021-08-23 VITALS — BP 160/98 | HR 78 | Ht 63.5 in | Wt 184.0 lb

## 2021-08-23 DIAGNOSIS — I779 Disorder of arteries and arterioles, unspecified: Secondary | ICD-10-CM | POA: Insufficient documentation

## 2021-08-23 DIAGNOSIS — G453 Amaurosis fugax: Secondary | ICD-10-CM

## 2021-08-23 DIAGNOSIS — I2510 Atherosclerotic heart disease of native coronary artery without angina pectoris: Secondary | ICD-10-CM | POA: Diagnosis not present

## 2021-08-23 DIAGNOSIS — I6521 Occlusion and stenosis of right carotid artery: Secondary | ICD-10-CM

## 2021-08-23 DIAGNOSIS — I10 Essential (primary) hypertension: Secondary | ICD-10-CM

## 2021-08-23 DIAGNOSIS — Z7689 Persons encountering health services in other specified circumstances: Secondary | ICD-10-CM

## 2021-08-23 DIAGNOSIS — E785 Hyperlipidemia, unspecified: Secondary | ICD-10-CM

## 2021-08-23 MED ORDER — DIAZEPAM 5 MG PO TABS: 5.0000 mg | ORAL_TABLET | Freq: Every day | ORAL | 0 refills | Status: AC | PRN

## 2021-08-23 NOTE — Progress Notes (Signed)
Cardiology Office Note:    Date:  08/23/2021   ID:  Samantha Russo, DOB Nov 30, 1961, MRN [id number]  PCP:  Pcp, No  Cardiologist:  Gypsy Balsam, MD    Referring MD: No ref. provider found   Chief Complaint  Patient presents with   R carotic  100% blockage     History of Present Illness:    Samantha Russo is a 60 y.o. female with complex past medical history.  Apparently she relocated to our area from [place] she was referred to me because of episode of chest pain.  She does have multiple risk factors for coronary artery disease including hypertension, dyslipidemia very high LDL that initially was not treated smoking.  She did have echocardiogram done which showed normal left ventricle ejection fraction left-ventricular hypertrophy.  There were no segmental motion abnormalities, after that she did not coronary CT angio which showed completely occluded right coronary artery with nonobstructive mild to moderate disease elsewhere.  Decision has been made to treat her medically.  She was put on antiplatelet therapy as well as beta-blocker and statin.  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  However cervical and brain CT angiogram showed completely occluded right internal carotic artery at the base, therefore, surgery has not been indicated.  She also got normal antegrade flow within vertebral arteries. She comes today to my office for follow-up she is very scared and anxious about all situations.  She said she is getting better we had a long discussion explained to her what her  finding where and she is feeling better about this.  She wanted to give her some Valium to help with anxiety.  Past Medical History:  Diagnosis Date   Atypical chest pain 07/25/2021   Cervical disc disorder with radiculopathy 08/09/2015   Chronic neck and back pain    Hypertension    Smoking 07/25/2021    Past Surgical History:  Procedure Laterality Date   PLACEMENT OF BREAST IMPLANTS     SPINAL FUSION     2012 2013    Current Medications: Current Meds  Medication Sig   aspirin EC 81 MG tablet Take 1 tablet (81 mg total) by mouth daily. Swallow whole.   clopidogrel (PLAVIX) 75 MG tablet Take 1 tablet (75 mg total) by mouth daily.   diazepam (VALIUM) 10 MG tablet Take 5 mg by mouth daily as needed for anxiety.   nebivolol (BYSTOLIC) 5 MG tablet Take 1 tablet (5 mg total) by mouth daily.   nitroGLYCERIN (NITROLINGUAL) 0.4 MG/SPRAY spray Place 1 spray under the tongue every 5 (five) minutes x 3 doses as needed for chest pain (Do not exceed 3 times in a day and Call 911 if chest pain continues or worsening).   rosuvastatin (CRESTOR) 40 MG tablet Take 1 tablet (40 mg total) by mouth daily.   spironolactone (ALDACTONE) 25 MG tablet Take 1 tablet (25 mg total) by mouth daily.   Vitamin D, Ergocalciferol, (DRISDOL) 1.25 MG (50000 UNIT) CAPS capsule Take 50,000 Units by mouth every 3 (three) days.     Allergies:  Patient has no known allergies.   Social History   Socioeconomic History   Marital status: Married    Spouse name: Not on file   Number of children: Not on file   Years of education: Not on file   Highest education level: Not on file  Occupational History   Not on file  Tobacco Use   Smoking status: Every Day    Packs/day: 0.10    Types: Cigarettes   Smokeless tobacco: Never  Vaping Use   Vaping Use: Never used  Substance and Sexual Activity   Alcohol use: Yes   Drug use: Never   Sexual activity: Not on file  Other Topics Concern   Not on file  Social History  Narrative   Not on file   Social Determinants of Health   Financial Resource Strain: Not on file  Food Insecurity: Not on file  Transportation Needs: Not on file  Physical Activity: Not on file  Stress: Not on file  Social Connections: Not on file     Family History: The patient's family history includes Diabetes in her father; Heart failure in her father. ROS:   Please see the history of present illness.    All 14 point review of systems negative except as described per history of present illness  EKGs/Labs/Other Studies Reviewed:      Recent Labs: 08/18/2021: ALT 32 08/19/2021: BUN 10; Creatinine, Ser 0.57; Hemoglobin 13.5; Platelets 312; Potassium 3.5; Sodium 135  Recent Lipid Panel    Component Value Date/Time   CHOL 299 (H) 07/25/2021 1224   TRIG 232 (H) 07/25/2021 1224   HDL 56 07/25/2021 1224   CHOLHDL 5.3 (H) 07/25/2021 1224   LDLCALC 198 (H) 07/25/2021 1224    Physical Exam:    VS:  BP (!) 160/98 (BP Location: Right Arm, Patient Position: Sitting)   Pulse 78   Ht 5' 3.5" (1.613 m)   Wt 184 lb (83.5 kg)   LMP  (LMP Unknown)   SpO2 95%   BMI 32.08 kg/m     Wt Readings from Last 3 Encounters:  08/23/21 184 lb (83.5 kg)  08/18/21 185 lb 3 oz (84 kg)  08/08/21 185 lb (83.9 kg)     GEN:  Well nourished, well developed in no acute distress HEENT: Normal NECK: No JVD; No carotid bruits LYMPHATICS: No lymphadenopathy CARDIAC: RRR, no murmurs, no rubs, no gallops RESPIRATORY:  Clear to auscultation without rales, wheezing or rhonchi  ABDOMEN: Soft, non-tender, non-distended MUSCULOSKELETAL:  No edema; No deformity  SKIN: Warm and dry LOWER EXTREMITIES: no swelling NEUROLOGIC:  Alert and oriented x 3 PSYCHIATRIC:  Normal affect   ASSESSMENT:    1. Coronary artery disease involving native coronary artery of native heart without angina pectoris   2. Occlusion of right carotid artery   3. Primary hypertension   4. Dyslipidemia    PLAN:    In order  of problems listed above:  Coronary disease she does have what appears to be completely occluded right coronary artery.  Now appears to be asymptomatic decreased risk factors modifications. Occlusion of the right carotic artery she does have some episodes of amaurosis fugax.  I will refer her to our neurologist. Essential hypertension blood pressure elevated today but she is very emotional.  Vascular check blood pressure on the regular basis and will treat accordingly. Dyslipidemia Crestor has been increased will check to 6 weeks from now her cholesterol.   That was a very lengthy visit I did review a  lot of records from the hospital   Medication Adjustments/Labs and Tests Ordered: Current medicines are reviewed at length with the patient today.  Concerns regarding medicines are outlined above.  No orders of the defined types were placed in this encounter.  Medication changes: No orders of the defined types were placed in this encounter.   Signed, Georgeanna Lea, MD, [facility] 08/23/2021 3:51 PM    [facility]

## 2021-08-23 NOTE — Patient Instructions (Signed)
Medication Instructions:   Your physician has recommended you make the following change in your medication:  START: Valium 5 mg once daily as needed. (Total 20 tablets) *If you need a refill on your cardiac medications before your next appointment, please call your pharmacy*   Lab Work: Your physician recommends that you return for lab work in:  In 6 weeks: Lipids - please come fasting If you have labs (blood work) drawn today and your tests are completely normal, you will receive your results only by: MyChart Message (if you have MyChart) OR A paper copy in the mail If you have any lab test that is abnormal or we need to change your treatment, we will call you to review the results.   Testing/Procedures: None   Follow-Up: At [facility], you and your health needs are our priority.  As part of our continuing mission to provide you with exceptional heart care, we have created designated Provider Care Teams.  These Care Teams include your primary Cardiologist (physician) and Advanced Practice Providers (APPs -  Physician Assistants and Nurse Practitioners) who all work together to provide you with the care you need, when you need it.  We recommend signing up for the patient portal called "MyChart".  Sign up information is provided on this After Visit Summary.  MyChart is used to connect with patients for Virtual Visits (Telemedicine).  Patients are able to view lab/test results, encounter notes, upcoming appointments, etc.  Non-urgent messages can be sent to your provider as well.   To learn more about what you can do with MyChart, go to [email or web address].    Your next appointment:   3 month(s)  The format for your next appointment:   In Person  Provider:   Gypsy Balsam, MD   Other Instructions

## 2021-08-25 ENCOUNTER — Encounter: Payer: Self-pay | Admitting: Neurology

## 2021-09-12 ENCOUNTER — Ambulatory Visit: Payer: Managed Care, Other (non HMO) | Admitting: Cardiology

## 2021-09-15 NOTE — Progress Notes (Signed)
NEUROLOGY CONSULTATION NOTE  Samantha Russo MRN: [id number] DOB: September 11, 1961  Referring provider: Gypsy Balsam, MD Primary care provider: No PCP  Reason for consult:  amaurosis fugax  Assessment/Plan:   Right amaurosis fugax secondary to right internal carotid artery occlusion Hypertension Hyperlipidemia Tobacco use disorder Coronary artery disease  1)  Secondary stroke prevention as managed by PCP and cardiology: - Continue DAPT for another 2 months.  On 11/19, she may discontinue ASA and continue Plavix 75mg  daily. - Statin therapy.  LDL goal less than 70 - Hgb A1c goal less than 7 - Normotensive blood pressure 2)  Smoking cessation - already trying to quit. 3)  Mediterranean diet 4)  Routine exercise. 5)  Follow up 6 months.   Subjective:  Samantha Russo is a 60 year old right-handed female with CAD, HTN, tobacco use disorder and chronic neck and back pain who presents for right amaurosis fugax.  History supplemented by referring provider's note.  In July, she started feeling chest pain, fatigue and dizziness.  She didn't immediately see the doctor but later had an EKG which showed probable MI.  She was started on ASA 81mg  daily.  One day in August, she woke up feeling dizzy and lost vision in her right eye for 20 seconds.  A couple of weeks later, she was driving and again lost vision in right eye with dizziness.  Carotid ultrasound on 08/18/2021 demonstrated severe proximal right ICA stenosis.  She was going to undergo intervention but follow up CTA of head and neck on 08/19/2021 demonstrated complete occlusion of the right ICA at the base and, therefore, surgery was not an option.  Plavix was added and Crestor was increased.  Since then, she still feels dizzy at times.  She is anxious about the possibility of another stroke  Lipid panel in July demonstrated total chol of 299, TG 232, HDL 56 and LDL 198.  Serum glucose was 107.  Echocardiogram on 08/07/2021 showed EF  55-60% with moderate left ventricular hypertrophy but no regional wall motion abnormalities or IAS/shunts.     Put on ASA 81mg  and Plavix -    Feeling off balance, feels like dripping on back of brain - fatigue  PAST MEDICAL HISTORY: Past Medical History:  Diagnosis Date   Atypical chest pain 07/25/2021   Cervical disc disorder with radiculopathy 08/09/2015   Chronic neck and back pain    Hypertension    Smoking 07/25/2021    PAST SURGICAL HISTORY: Past Surgical History:  Procedure Laterality Date   PLACEMENT OF BREAST IMPLANTS     SPINAL FUSION     2012 2013    MEDICATIONS: Current Outpatient Medications on File Prior to Visit  Medication Sig Dispense Refill   aspirin EC 81 MG tablet Take 1 tablet (81 mg total) by mouth daily. Swallow whole. 90 tablet 3   clopidogrel (PLAVIX) 75 MG tablet Take 1 tablet (75 mg total) by mouth daily. 90 tablet 0   diazepam (VALIUM) 5 MG tablet Take 1 tablet (5 mg total) by mouth daily as needed for anxiety. 20 tablet 0   nebivolol (BYSTOLIC) 5 MG tablet Take 1 tablet (5 mg total) by mouth daily. 90 tablet 1   nitroGLYCERIN (NITROLINGUAL) 0.4 MG/SPRAY spray Place 1 spray under the tongue every 5 (five) minutes x 3 doses as needed for chest pain (Do not exceed 3 times in a day and Call 911 if chest pain continues or worsening). 12 g 12   rosuvastatin (CRESTOR) 40 MG tablet Take  1 tablet (40 mg total) by mouth daily. 90 tablet 3   spironolactone (ALDACTONE) 25 MG tablet Take 1 tablet (25 mg total) by mouth daily. 90 tablet 1   Vitamin D, Ergocalciferol, (DRISDOL) 1.25 MG (50000 UNIT) CAPS capsule Take 50,000 Units by mouth every 3 (three) days.     No current facility-administered medications on file prior to visit.    ALLERGIES: No Known Allergies  FAMILY HISTORY: Family History  Problem Relation Age of Onset   Heart failure Father    Diabetes Father     Objective:  Blood pressure 131/85, pulse 81, height 5\' 4"  (1.626 m), weight 188 lb (85.3  kg), SpO2 95 %. General: No acute distress.  Patient appears well-groomed.   Head:  Normocephalic/atraumatic Eyes:  fundi examined but not visualized Neck: supple, no paraspinal tenderness, full range of motion Back: No paraspinal tenderness Heart: regular rate and rhythm Lungs: Clear to auscultation bilaterally. Vascular: No carotid bruits. Neurological Exam: Mental status: alert and oriented to person, place, and time, recent and remote memory intact, fund of knowledge intact, attention and concentration intact, speech fluent and not dysarthric, language intact. Cranial nerves: CN I: not tested CN II: pupils equal, round and reactive to light, visual fields intact CN III, IV, VI:  full range of motion, no nystagmus, no ptosis CN V: facial sensation intact. CN VII: upper and lower face symmetric CN VIII: hearing intact CN IX, X: gag intact, uvula midline CN XI: sternocleidomastoid and trapezius muscles intact CN XII: tongue midline Bulk & Tone: normal, no fasciculations. Motor:  muscle strength 5/5 throughout Sensation:  Pinprick, temperature and vibratory sensation intact. Deep Tendon Reflexes:  2+ throughout,  toes downgoing.   Finger to nose testing:  Without dysmetria.   Heel to shin:  Without dysmetria.   Gait:  Normal station and stride.  Romberg negative.    Thank you for allowing me to take part in the care of this patient.  , DO  CC:  Shon Millet, MD  Herbie Drape, MD

## 2021-09-18 ENCOUNTER — Ambulatory Visit (INDEPENDENT_AMBULATORY_CARE_PROVIDER_SITE_OTHER): Payer: Managed Care, Other (non HMO) | Admitting: Neurology

## 2021-09-18 ENCOUNTER — Encounter: Payer: Self-pay | Admitting: Neurology

## 2021-09-18 ENCOUNTER — Other Ambulatory Visit: Payer: Self-pay

## 2021-09-18 VITALS — BP 131/85 | HR 81 | Ht 64.0 in | Wt 188.0 lb

## 2021-09-18 DIAGNOSIS — I10 Essential (primary) hypertension: Secondary | ICD-10-CM | POA: Diagnosis not present

## 2021-09-18 DIAGNOSIS — E785 Hyperlipidemia, unspecified: Secondary | ICD-10-CM

## 2021-09-18 DIAGNOSIS — F17200 Nicotine dependence, unspecified, uncomplicated: Secondary | ICD-10-CM

## 2021-09-18 DIAGNOSIS — G453 Amaurosis fugax: Secondary | ICD-10-CM

## 2021-09-18 DIAGNOSIS — I6521 Occlusion and stenosis of right carotid artery: Secondary | ICD-10-CM | POA: Diagnosis not present

## 2021-09-18 DIAGNOSIS — I2510 Atherosclerotic heart disease of native coronary artery without angina pectoris: Secondary | ICD-10-CM

## 2021-09-18 NOTE — Patient Instructions (Signed)
Continue aspirin 81mg  daily and Plavix 75mg  daily.  In 2 months (Nov 19), stop aspirin and just continue Plavix 75mg  daily Continue rosuvastatin Continue blood pressure medication Routine exercise Mediterranean diet Continue trying to quit smoking Follow up 6 months.

## 2021-10-05 ENCOUNTER — Encounter: Payer: Self-pay | Admitting: Vascular Surgery

## 2021-10-05 ENCOUNTER — Ambulatory Visit (INDEPENDENT_AMBULATORY_CARE_PROVIDER_SITE_OTHER): Payer: Managed Care, Other (non HMO) | Admitting: Vascular Surgery

## 2021-10-05 ENCOUNTER — Other Ambulatory Visit: Payer: Self-pay

## 2021-10-05 VITALS — BP 127/83 | HR 87 | Temp 98.7°F | Resp 20 | Ht 64.0 in | Wt 188.0 lb

## 2021-10-05 DIAGNOSIS — I6523 Occlusion and stenosis of bilateral carotid arteries: Secondary | ICD-10-CM | POA: Diagnosis not present

## 2021-10-05 MED ORDER — CLOPIDOGREL BISULFATE 75 MG PO TABS: 75.0000 mg | ORAL_TABLET | Freq: Every day | ORAL | 11 refills | Status: DC

## 2021-10-05 NOTE — Progress Notes (Signed)
REASON FOR VISIT:   Follow-up of carotid disease  MEDICAL ISSUES:   BILATERAL CAROTID DISEASE: This patient has a distal right ICA occlusion with no surgical options on the right.  She had a 40 to 59% left carotid stenosis by duplex but the CT scan showed minimal stenosis on the left.  Since her hospitalization she has had no new focal weakness or paresthesias.  She is on aspirin, Plavix, and a statin.  I have ordered a follow-up carotid duplex scan in February 2023 and I will see her at that time.  She knows to call sooner if she has problems.  HPI:   Samantha Russo is a pleasant 59 y.o. female who I saw in consultation on 08/18/2021 with a symptomatic right carotid stenosis.  She had episodes of transient loss of vision in the right eye over the last 2 weeks.  She was also having some problems with dizziness.  Of note she had a myocardial infarction in July.  Her blood pressure was poorly controlled.  She was just started on aspirin and a statin during that admission.  I recommended a CT angiogram to further assess her carotid stenosis and to see if this was surgically accessible.  Of note, the carotid duplex scan had suggested a greater then 80% right carotid stenosis with a 40 to 59% left carotid stenosis.  The CT angiogram showed that the right internal carotid artery was occluded at the base of the skull.  There was also severe stenosis in the distal cervical right ICA.  There was nothing to offer from a surgical standpoint.  She had a 40 to 59% left carotid stenosis and the CT angiogram really showed no significant disease on the left.  I will plan on arranging a follow-up duplex scan in 6 months.  She was then to do maximal medical therapy including aspirin, Plavix, and statin therapy.  We also discussed the importance of tobacco cessation.  Since she was in the hospital she is had no new focal weakness or paresthesias.  She denies expressive or receptive aphasia.  She had no amaurosis  fugax.  She does have multiple other complaints including some mild headache at times and occasional dizzy spells.  She also "just does not feel right.".   She is on aspirin, Plavix, and a statin.  Past Medical History:  Diagnosis Date   Atypical chest pain 07/25/2021   Carotid artery occlusion    Cervical disc disorder with radiculopathy 08/09/2015   Chronic neck and back pain    Hypertension    Smoking 07/25/2021    Family History  Problem Relation Age of Onset   Heart failure Father    Diabetes Father    Stroke Maternal Grandmother    Heart failure Maternal Grandmother     SOCIAL HISTORY: Social History   Tobacco Use   Smoking status: Every Day    Packs/day: 0.10    Types: Cigarettes   Smokeless tobacco: Never  Substance Use Topics   Alcohol use: Yes    No Known Allergies  Current Outpatient Medications  Medication Sig Dispense Refill   aspirin EC 81 MG tablet Take 1 tablet (81 mg total) by mouth daily. Swallow whole. 90 tablet 3   clopidogrel (PLAVIX) 75 MG tablet Take 1 tablet (75 mg total) by mouth daily. 90 tablet 0   clopidogrel (PLAVIX) 75 MG tablet Take 1 tablet (75 mg total) by mouth daily. 30 tablet 11   diazepam (VALIUM) 5 MG tablet Take  1 tablet (5 mg total) by mouth daily as needed for anxiety. 20 tablet 0   nebivolol (BYSTOLIC) 5 MG tablet Take 1 tablet (5 mg total) by mouth daily. 90 tablet 1   nitroGLYCERIN (NITROLINGUAL) 0.4 MG/SPRAY spray Place 1 spray under the tongue every 5 (five) minutes x 3 doses as needed for chest pain (Do not exceed 3 times in a day and Call 911 if chest pain continues or worsening). 12 g 12   rosuvastatin (CRESTOR) 40 MG tablet Take 1 tablet (40 mg total) by mouth daily. 90 tablet 3   spironolactone (ALDACTONE) 25 MG tablet Take 1 tablet (25 mg total) by mouth daily. 90 tablet 1   vitamin C (ASCORBIC ACID) 500 MG tablet Take 500 mg by mouth daily.     Vitamin D, Ergocalciferol, (DRISDOL) 1.25 MG (50000 UNIT) CAPS capsule  Take 50,000 Units by mouth every 3 (three) days.     No current facility-administered medications for this visit.    REVIEW OF SYSTEMS:  [X]  denotes positive finding, [ ]  denotes negative finding Cardiac  Comments:  Chest pain or chest pressure:    Shortness of breath upon exertion:    Short of breath when lying flat:    Irregular heart rhythm:        Vascular    Pain in calf, thigh, or hip brought on by ambulation:    Pain in feet at night that wakes you up from your sleep:     Blood clot in your veins:    Leg swelling:         Pulmonary    Oxygen at home:    Productive cough:     Wheezing:         Neurologic    Sudden weakness in arms or legs:     Sudden numbness in arms or legs:     Sudden onset of difficulty speaking or slurred speech:    Temporary loss of vision in one eye:     Problems with dizziness:         Gastrointestinal    Blood in stool:     Vomited blood:         Genitourinary    Burning when urinating:     Blood in urine:        Psychiatric    Major depression:         Hematologic    Bleeding problems:    Problems with blood clotting too easily:        Skin    Rashes or ulcers:        Constitutional    Fever or chills:     PHYSICAL EXAM:   Vitals:   10/05/21 1131 10/05/21 1134  BP: (!) 143/83 127/83  Pulse: 87   Resp: 20   Temp: 98.7 F (37.1 C)   SpO2: 93%   Weight: 188 lb (85.3 kg)   Height: 5\' 4"  (1.626 m)     GENERAL: The patient is a well-nourished female, in no acute distress. The vital signs are documented above. CARDIAC: There is a regular rate and rhythm.  VASCULAR: I do not detect carotid bruits. She has a palpable posterior tibial pulse bilaterally. PULMONARY: There is good air exchange bilaterally without wheezing or rales. ABDOMEN: Soft and non-tender with normal pitched bowel sounds.  MUSCULOSKELETAL: There are no major deformities or cyanosis. NEUROLOGIC: No focal weakness or paresthesias are detected. SKIN: There  are no ulcers or rashes noted. PSYCHIATRIC: The patient  has a normal affect.  DATA:    No new data.  Samantha Russo [facility] [phone]

## 2021-10-06 ENCOUNTER — Other Ambulatory Visit: Payer: Self-pay

## 2021-10-06 DIAGNOSIS — I6523 Occlusion and stenosis of bilateral carotid arteries: Secondary | ICD-10-CM

## 2021-11-09 ENCOUNTER — Ambulatory Visit: Payer: Managed Care, Other (non HMO) | Admitting: Cardiology

## 2021-11-18 ENCOUNTER — Emergency Department (HOSPITAL_COMMUNITY): Payer: Managed Care, Other (non HMO)

## 2021-11-18 ENCOUNTER — Emergency Department (HOSPITAL_COMMUNITY)
Admission: EM | Admit: 2021-11-18 | Discharge: 2021-11-18 | Disposition: A | Discharge status: Home / Self Care | Payer: Managed Care, Other (non HMO) | Attending: Emergency Medicine | Admitting: Emergency Medicine

## 2021-11-18 DIAGNOSIS — Z7982 Long term (current) use of aspirin: Secondary | ICD-10-CM | POA: Insufficient documentation

## 2021-11-18 DIAGNOSIS — S8012XA Contusion of left lower leg, initial encounter: Secondary | ICD-10-CM | POA: Diagnosis not present

## 2021-11-18 DIAGNOSIS — S8011XA Contusion of right lower leg, initial encounter: Secondary | ICD-10-CM | POA: Diagnosis not present

## 2021-11-18 DIAGNOSIS — I2510 Atherosclerotic heart disease of native coronary artery without angina pectoris: Secondary | ICD-10-CM | POA: Diagnosis not present

## 2021-11-18 DIAGNOSIS — Z7902 Long term (current) use of antithrombotics/antiplatelets: Secondary | ICD-10-CM | POA: Insufficient documentation

## 2021-11-18 DIAGNOSIS — I10 Essential (primary) hypertension: Secondary | ICD-10-CM | POA: Diagnosis not present

## 2021-11-18 DIAGNOSIS — Z79899 Other long term (current) drug therapy: Secondary | ICD-10-CM | POA: Insufficient documentation

## 2021-11-18 DIAGNOSIS — X58XXXA Exposure to other specified factors, initial encounter: Secondary | ICD-10-CM | POA: Diagnosis not present

## 2021-11-18 DIAGNOSIS — R519 Headache, unspecified: Secondary | ICD-10-CM | POA: Diagnosis not present

## 2021-11-18 DIAGNOSIS — F17210 Nicotine dependence, cigarettes, uncomplicated: Secondary | ICD-10-CM | POA: Diagnosis not present

## 2021-11-18 DIAGNOSIS — S8991XA Unspecified injury of right lower leg, initial encounter: Secondary | ICD-10-CM | POA: Diagnosis present

## 2021-11-18 DIAGNOSIS — T148XXA Other injury of unspecified body region, initial encounter: Secondary | ICD-10-CM

## 2021-11-18 LAB — BASIC METABOLIC PANEL
Anion gap: 12 (ref 5–15)
BUN: 11 mg/dL (ref 6–20)
CO2: 19 mmol/L — ABNORMAL LOW (ref 22–32)
Calcium: 8.9 mg/dL (ref 8.9–10.3)
Chloride: 106 mmol/L (ref 98–111)
Creatinine, Ser: 0.58 mg/dL (ref 0.44–1.00)
GFR, Estimated: >60 mL/min (ref >60)
Glucose, Bld: 86 mg/dL (ref 70–99)
Potassium: 4.4 mmol/L (ref 3.5–5.1)
Sodium: 137 mmol/L (ref 135–145)

## 2021-11-18 LAB — CBC
HCT: 42.5 % (ref 36.0–46.0)
Hemoglobin: 14.2 g/dL (ref 12.0–15.0)
MCH: 30.5 pg (ref 26.0–34.0)
MCHC: 33.4 g/dL (ref 30.0–36.0)
MCV: 91.2 fL (ref 80.0–100.0)
Platelets: 324 10*3/uL (ref 150–400)
RBC: 4.66 MIL/uL (ref 3.87–5.11)
RDW: 14.4 % (ref 11.5–15.5)
WBC: 9.3 10*3/uL (ref 4.0–10.5)
nRBC: 0 % (ref 0.0–0.2)

## 2021-11-18 LAB — D-DIMER, QUANTITATIVE: D-Dimer, Quant: 0.31 ug/mL-FEU (ref 0.00–0.50)

## 2021-11-18 NOTE — ED Provider Notes (Signed)
7:29 AM Care assumed from Dr. Blinda Leatherwood.  At time of transfer of care, patient is waiting for results of CT scan.  Plan of care is to discharge back home if CT is reassuring.  8:12 AM CT scan returned normal.  Patient was very reassured based on this finding.  Patient will be discharged home per previous team's plan.  Patient agrees and was discharged in good condition.   Clinical Impression: 1. Bruising     Disposition: Discharge  Condition: Good  I have discussed the results, Dx and Tx plan with the pt(& family if present). He/she/they expressed understanding and agree(s) with the plan. Discharge instructions discussed at great length. Strict return precautions discussed and pt &/or family have verbalized understanding of the instructions. No further questions at time of discharge.    New Prescriptions   No medications on file    Follow Up: Loyola Mast, MD [address] [place] [address] [phone]        Antion Andres, Canary Brim, MD 11/18/21 [phone]

## 2021-11-18 NOTE — ED Notes (Signed)
Went to assess pt, was pleasant upon greeting then pt became increasingly agitated and anxious from assessment and questioning. Pt admits to taking 5mg  of Valium PTA. Pt reports feeling anxious and concerned that no one has come to assess her yet with the blood clots she has been having. Pt states she will need a scan done but gets claustrophobic and is wanting to know what scans she will be needing to get done.

## 2021-11-18 NOTE — ED Triage Notes (Signed)
Pt BIB EMS for bruises on her legs and on eon her right hand that the pt believes are blood clots. Pt has a HX of blood clots. Pt states to EMS that she is worried "They are going to travel to my lungs and heart and kill me" . Pt reports drinking 2 and a half glasses of champagne tonight   Pt is on plavix   98/54 78 HR  97 RA

## 2021-11-18 NOTE — ED Notes (Signed)
Pt ambulating to bathroom.

## 2021-11-18 NOTE — ED Provider Notes (Signed)
[facility] EMERGENCY DEPARTMENT Provider Note   CSN: [id number] Arrival date & time: 11/18/21  5638     History Chief Complaint  Patient presents with   Leg Pain    Samantha Russo is a 60 y.o. female.  Patient presents to the emergency department with multiple problems.  Patient reports that she has noticed discolored spots on her legs over the last week and a half.  The areas that first showed up a week and a half ago are now becoming yellowish-green and she has some new ones on the lower legs.  Patient reports that she is convinced that these are blood clots and are going to travel to her heart and kill her.  She does not have any history of DVT.  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  She is on dual antiplatelet therapy with aspirin and Plavix.  She denies any injury to these areas.  Patient also reports that she has an intermittent pain on the right side of her head.  It causes her to have some difficulty with concentration.  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      Past Medical History:  Diagnosis Date   Atypical chest pain 07/25/2021   Carotid artery occlusion    Cervical disc disorder with radiculopathy 08/09/2015   Chronic neck and back pain    Hypertension    Smoking 07/25/2021    Patient Active Problem List   Diagnosis Date Noted   Carotid arterial disease (HCC) 08/23/2021   Dyslipidemia 08/23/2021   Amaurosis fugax of right eye 08/18/2021   Coronary artery disease completely occluded RCA with nonobstructive disease elsewhere based on coronary CT 08/08/2021   Atypical chest pain 07/25/2021   Hypertension 07/24/2021   Chronic neck and back pain 07/24/2021   Greater trochanteric bursitis 03/11/2019   Cervical disc disorder with radiculopathy 08/09/2015   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     Family History  Problem Relation Age of Onset   Heart failure Father    Diabetes Father    Stroke Maternal Grandmother    Heart failure Maternal Grandmother     Social History   Tobacco Use   Smoking status: Every Day    Packs/day: 0.10    Types: Cigarettes   Smokeless tobacco: Never  Vaping Use   Vaping Use: Never used  Substance Use Topics   Alcohol use: Yes   Drug use: Never    Home Medications Prior to Admission medications   Medication Sig Start Date End Date Taking? Authorizing Provider  aspirin EC 81 MG tablet Take 1 tablet (81 mg total) by mouth daily. Swallow whole. 07/25/21   Georgeanna Lea, MD  clopidogrel (PLAVIX) 75 MG tablet Take 1 tablet (75 mg total) by mouth daily. 08/20/21   Cipriano Bunker, MD  clopidogrel (PLAVIX) 75 MG tablet Take 1 tablet (75 mg total) by mouth daily. 10/05/21   Chuck Hint, MD  diazepam (VALIUM) 5 MG tablet Take 1 tablet (5 mg total) by mouth daily as needed for anxiety. 08/23/21   Georgeanna Lea, MD  nebivolol (BYSTOLIC) 5 MG tablet Take 1 tablet (5 mg total)  by mouth daily. 07/25/21   Georgeanna Lea, MD  nitroGLYCERIN (NITROLINGUAL) 0.4 MG/SPRAY spray Place 1 spray under the tongue every 5 (five) minutes x 3 doses as needed for chest pain (Do not exceed 3 times in a day and Call 911 if chest pain continues or worsening). 07/25/21   Georgeanna Lea, MD  rosuvastatin (CRESTOR) 40 MG tablet Take 1 tablet (40 mg total) by mouth daily. 08/19/21 11/17/21  Cipriano Bunker, MD  spironolactone (ALDACTONE) 25 MG tablet Take 1 tablet (25 mg total) by mouth daily. 07/25/21   Georgeanna Lea, MD  vitamin C (ASCORBIC ACID) 500 MG tablet Take 500 mg by mouth daily.    [provider]  Vitamin D, Ergocalciferol, (DRISDOL) 1.25 MG (50000 UNIT) CAPS capsule Take 50,000 Units  by mouth every 3 (three) days.    [provider]    Allergies    Patient has no known allergies.  Review of Systems   Review of Systems  Skin:  Positive for color change.  Neurological:  Positive for headaches.  All other systems reviewed and are negative.  Physical Exam Updated Vital Signs BP (!) 96/57 (BP Location: Left Arm)   Pulse 90   Temp 98.4 F (36.9 C)   Resp 14   LMP  (LMP Unknown)   SpO2 94%   Physical Exam Vitals and nursing note reviewed.  Constitutional:      General: She is not in acute distress.    Appearance: Normal appearance. She is well-developed.  HENT:     Head: Normocephalic and atraumatic.     Right Ear: Hearing normal.     Left Ear: Hearing normal.     Nose: Nose normal.  Eyes:     Conjunctiva/sclera: Conjunctivae normal.     Pupils: Pupils are equal, round, and reactive to light.  Cardiovascular:     Rate and Rhythm: Regular rhythm.     Pulses:          Dorsalis pedis pulses are 2+ on the right side and 2+ on the left side.     Heart sounds: S1 normal and S2 normal. No murmur heard.   No friction rub. No gallop.  Pulmonary:     Effort: Pulmonary effort is normal. No respiratory distress.     Breath sounds: Normal breath sounds.  Chest:     Chest wall: No tenderness.  Abdominal:     General: Bowel sounds are normal.     Palpations: Abdomen is soft.     Tenderness: There is no abdominal tenderness. There is no guarding or rebound. Negative signs include Murphy's sign and McBurney's sign.     Hernia: No hernia is present.  Musculoskeletal:        General: Normal range of motion.     Cervical back: Normal range of motion and neck supple.  Skin:    General: Skin is warm and dry.     Findings: Bruising (Multiple discrete bruises on both lower extremities of various chronicity; webspace between thumb and index finger left hand) present. No rash.  Neurological:     Mental Status: She is alert and oriented to person, place, and  time.     GCS: GCS eye subscore is 4. GCS verbal subscore is 5. GCS motor subscore is 6.     Cranial Nerves: No cranial nerve deficit.     Sensory: No sensory deficit.     Coordination: Coordination normal.  Psychiatric:        Speech:  Speech normal.        Behavior: Behavior normal.        Thought Content: Thought content normal.    ED Results / Procedures / Treatments   Labs (all labs ordered are listed, but only abnormal results are displayed) Labs Reviewed  BASIC METABOLIC PANEL - Abnormal; Notable for the following components:      Result Value   CO2 19 (*)    All other components within normal limits  CBC  D-DIMER, QUANTITATIVE    EKG None  Radiology No results found.  Procedures Procedures   Medications Ordered in ED Medications - No data to display  ED Course  I have reviewed the triage vital signs and the nursing notes.  Pertinent labs & imaging results that were available during my care of the patient were reviewed by me and considered in my medical decision making (see chart for details).    MDM Rules/Calculators/A&P                           Patient concerned about the possibility of blood clots in her legs.  She does not have any circumferential swelling of the lower legs.  No venous cords.  No specific calf tenderness.  She has not had any recent immobilization or surgeries.  Examination reveals obvious discrete bruises, no signs that this would be consistent with DVT.  She has no overlying redness, erythema, warmth or signs of cellulitis.  She has palpable distal pulses.  Patient also concerned about headache with her history of carotid artery disease.  CT head does not show any acute pathology.  Patient's CBC is normal.  She has normal platelets.  No anemia.  Additionally, she has a normal D-dimer.  Wells criteria for both PE and DVT are low risk.  No further work-up necessary.   Final Clinical Impression(s) / ED Diagnoses Final diagnoses:   Bruising    Rx / DC Orders ED Discharge Orders     None        Christabell Loseke, Canary Brim, MD 11/18/21 [phone]

## 2021-11-21 ENCOUNTER — Ambulatory Visit: Payer: Managed Care, Other (non HMO) | Admitting: Neurology

## 2021-11-30 ENCOUNTER — Other Ambulatory Visit: Payer: Self-pay

## 2021-11-30 ENCOUNTER — Encounter: Payer: Self-pay | Admitting: Cardiology

## 2021-11-30 ENCOUNTER — Ambulatory Visit (INDEPENDENT_AMBULATORY_CARE_PROVIDER_SITE_OTHER): Payer: Managed Care, Other (non HMO) | Admitting: Cardiology

## 2021-11-30 VITALS — BP 132/76 | HR 91 | Ht 63.0 in | Wt 191.0 lb

## 2021-11-30 DIAGNOSIS — I2510 Atherosclerotic heart disease of native coronary artery without angina pectoris: Secondary | ICD-10-CM | POA: Diagnosis not present

## 2021-11-30 DIAGNOSIS — I10 Essential (primary) hypertension: Secondary | ICD-10-CM

## 2021-11-30 DIAGNOSIS — E785 Hyperlipidemia, unspecified: Secondary | ICD-10-CM | POA: Diagnosis not present

## 2021-11-30 DIAGNOSIS — I6521 Occlusion and stenosis of right carotid artery: Secondary | ICD-10-CM

## 2021-11-30 DIAGNOSIS — R0789 Other chest pain: Secondary | ICD-10-CM | POA: Diagnosis not present

## 2021-11-30 NOTE — Addendum Note (Signed)
Addended by: Hazle Quant on: 11/30/2021 02:55 PM   Modules accepted: Orders

## 2021-11-30 NOTE — Patient Instructions (Signed)
Medication Instructions:  Your physician recommends that you continue on your current medications as directed. Please refer to the Current Medication list given to you today.  *If you need a refill on your cardiac medications before your next appointment, please call your pharmacy*   Lab Work: Your physician recommends that you return for lab work today: lipid, lft  If you have labs (blood work) drawn today and your tests are completely normal, you will receive your results only by: MyChart Message (if you have MyChart) OR A paper copy in the mail If you have any lab test that is abnormal or we need to change your treatment, we will call you to review the results.   Testing/Procedures: None   Follow-Up: At [facility], you and your health needs are our priority.  As part of our continuing mission to provide you with exceptional heart care, we have created designated Provider Care Teams.  These Care Teams include your primary Cardiologist (physician) and Advanced Practice Providers (APPs -  Physician Assistants and Nurse Practitioners) who all work together to provide you with the care you need, when you need it.  We recommend signing up for the patient portal called "MyChart".  Sign up information is provided on this After Visit Summary.  MyChart is used to connect with patients for Virtual Visits (Telemedicine).  Patients are able to view lab/test results, encounter notes, upcoming appointments, etc.  Non-urgent messages can be sent to your provider as well.   To learn more about what you can do with MyChart, go to [email or web address].    Your next appointment:   5 month(s)  The format for your next appointment:   In Person  Provider:   Gypsy Balsam, MD    Other Instructions

## 2021-11-30 NOTE — Progress Notes (Signed)
Cardiology Office Note:    Date:  11/30/2021   ID:  Samantha Russo, DOB 1961-01-15, MRN [id number]  PCP:  Loyola Mast, MD  Cardiologist:  Gypsy Balsam, MD    Referring MD: Loyola Mast, MD   Chief Complaint  Patient presents with   Follow-up    Dizziness resolve, just headaches on one side of her head     History of Present Illness:    Samantha Russo is a 60 y.o. female   with complex past medical history.  Apparently she relocated to our area from [place] she was referred to me because of episode of chest pain.  She does have multiple risk factors for coronary artery disease including hypertension, dyslipidemia very high LDL that initially was not treated smoking.  She did have echocardiogram done which showed normal left ventricle ejection fraction left-ventricular hypertrophy.  There were no segmental motion abnormalities, after that she did not coronary CT angio which showed completely occluded right coronary artery with nonobstructive mild to moderate disease elsewhere.  Decision has been made to treat her medically.  She was put on antiplatelet therapy as well as beta-blocker and statin.  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  However cervical and brain CT angiogram showed completely occluded right internal carotic artery at the base, therefore, surgery has not been indicated.  She also got normal antegrade flow within vertebral arteries. She comes today to my office for follow-up.  Overall she seems to be doing well.  Denies have any chest pain tightness  squeezing pressure burning chest.  She is upset about her weight which is going up.  Likely dizziness that she suffered from before is gone.  Still complain of some headaches.  We had a long discussion about history of carotic artery occlusion as well as right coronary artery occlusion.  The goal right now is medical therapy  Past Medical History:  Diagnosis Date   Atypical chest pain 07/25/2021   Carotid artery occlusion    Cervical disc disorder with radiculopathy 08/09/2015   Chronic neck and back pain    Hypertension    Smoking 07/25/2021    Past Surgical History:  Procedure Laterality Date   PLACEMENT OF BREAST IMPLANTS     SPINAL FUSION     2012 2013    Current Medications: Current Meds  Medication Sig   aspirin EC 81 MG tablet Take 1 tablet (81 mg total) by mouth daily. Swallow whole.   clopidogrel (PLAVIX) 75 MG tablet Take 1 tablet (75 mg total) by mouth daily.   diazepam (VALIUM) 5 MG tablet Take 1 tablet (5 mg total) by mouth daily as needed for anxiety.   nebivolol (BYSTOLIC) 5 MG tablet Take 1 tablet (5 mg total) by mouth daily.   nitroGLYCERIN (NITROLINGUAL) 0.4 MG/SPRAY spray Place 1 spray under the tongue every 5 (five) minutes x 3 doses as needed for chest pain (Do not exceed 3 times in a day and Call 911 if chest pain continues or worsening).   rosuvastatin (CRESTOR) 40 MG tablet Take 1 tablet (40 mg  total) by mouth daily.   spironolactone (ALDACTONE) 25 MG tablet Take 1 tablet (25 mg total) by mouth daily.   vitamin C (ASCORBIC ACID) 500 MG tablet Take 500 mg by mouth daily.   Vitamin D, Ergocalciferol, (DRISDOL) 1.25 MG (50000 UNIT) CAPS capsule Take 50,000 Units by mouth every 3 (three) days.     Allergies:   Patient has no known allergies.   Social History   Socioeconomic History   Marital status: Married    Spouse name: Not on file   Number of children: Not on file   Years of education: Not on file   Highest education level: Not on file  Occupational  History   Not on file  Tobacco Use   Smoking status: Every Day    Packs/day: 0.10    Types: Cigarettes   Smokeless tobacco: Never  Vaping Use   Vaping Use: Never used  Substance and Sexual Activity   Alcohol use: Yes   Drug use: Never   Sexual activity: Not on file  Other Topics Concern   Not on file  Social History Narrative   Right handed   Social Determinants of Health   Financial Resource Strain: Not on file  Food Insecurity: Not on file  Transportation Needs: Not on file  Physical Activity: Not on file  Stress: Not on file  Social Connections: Not on file     Family History: The patient's family history includes Diabetes in her father; Heart failure in her father and maternal grandmother; Stroke in her maternal grandmother. ROS:   Please see the history of present illness.    All 14 point review of systems negative except as described per history of present illness  EKGs/Labs/Other Studies Reviewed:      Recent Labs: 08/18/2021: ALT 32 11/18/2021: BUN 11; Creatinine, Ser 0.58; Hemoglobin 14.2; Platelets 324; Potassium 4.4; Sodium 137  Recent Lipid Panel    Component Value Date/Time   CHOL 299 (H) 07/25/2021 1224   TRIG 232 (H) 07/25/2021 1224   HDL 56 07/25/2021 1224   CHOLHDL 5.3 (H) 07/25/2021 1224   LDLCALC 198 (H) 07/25/2021 1224    Physical Exam:    VS:  BP 132/76 (BP Location: Right Arm, Patient Position: Sitting)   Pulse 91   Ht 5\' 3"  (1.6 m)   Wt 191 lb (86.6 kg)   LMP  (LMP Unknown)   SpO2 96%   BMI 33.83 kg/m     Wt Readings from Last 3 Encounters:  11/30/21 191 lb (86.6 kg)  10/05/21 188 lb (85.3 kg)  09/18/21 188 lb (85.3 kg)     GEN:  Well nourished, well developed in no acute distress HEENT: Normal NECK: No JVD; No carotid bruits LYMPHATICS: No lymphadenopathy CARDIAC: RRR, no murmurs, no rubs, no gallops RESPIRATORY:  Clear to auscultation without rales, wheezing or rhonchi  ABDOMEN: Soft, non-tender,  non-distended MUSCULOSKELETAL:  No edema; No deformity  SKIN: Warm and dry LOWER EXTREMITIES: no swelling NEUROLOGIC:  Alert and oriented x 3 PSYCHIATRIC:  Normal affect   ASSESSMENT:    No diagnosis found. PLAN:    In order of problems listed above:  Coronary artery disease, she is on dual antiplatelet therapy which I will continue.  She is on statin I will check today fasting lipid profile.  Asymptomatic. Carotid artery occlusion follow-up followed by neurology.  Conservative approach no recent doing deep vein, decreased risk factors modification of that with Essential hypertension blood pressure well controlled continue present management. Dyslipidemia I  did review K PN which show me her LDL of 198, HDL 56 this is from 07/25/2021 today we will check her cholesterol.  After that results of the blood test she was started on Crestor 40   Medication Adjustments/Labs and Tests Ordered: Current medicines are reviewed at length with the patient today.  Concerns regarding medicines are outlined above.  No orders of the defined types were placed in this encounter.  Medication changes: No orders of the defined types were placed in this encounter.   Signed, Georgeanna Lea, MD, [facility] 11/30/2021 2:50 PM    [facility]

## 2021-12-01 LAB — HEPATIC FUNCTION PANEL
ALT: 31 IU/L (ref 0–32)
AST: 22 IU/L (ref 0–40)
Albumin: 4.9 g/dL (ref 3.8–4.9)
Alkaline Phosphatase: 134 IU/L — ABNORMAL HIGH (ref 44–121)
Bilirubin Total: 0.2 mg/dL (ref 0.0–1.2)
Bilirubin, Direct: <0.1 mg/dL (ref 0.00–0.40)
Total Protein: 7.4 g/dL (ref 6.0–8.5)

## 2021-12-01 LAB — LIPID PANEL
Chol/HDL Ratio: 3.3 ratio (ref 0.0–4.4)
Cholesterol, Total: 185 mg/dL (ref 100–199)
HDL: 56 mg/dL (ref >39)
LDL Chol Calc (NIH): 103 mg/dL — ABNORMAL HIGH (ref 0–99)
Triglycerides: 149 mg/dL (ref 0–149)
VLDL Cholesterol Cal: 26 mg/dL (ref 5–40)

## 2021-12-04 ENCOUNTER — Telehealth: Payer: Self-pay | Admitting: Emergency Medicine

## 2021-12-04 DIAGNOSIS — E785 Hyperlipidemia, unspecified: Secondary | ICD-10-CM

## 2021-12-04 MED ORDER — EZETIMIBE 10 MG PO TABS: 10.0000 mg | ORAL_TABLET | Freq: Every day | ORAL | 1 refills | Status: DC

## 2021-12-04 NOTE — Telephone Encounter (Signed)
-----   Message from Georgeanna Lea, MD sent at 12/04/2021  8:40 AM EST ----- Cholesterol dramatically better LDL from 198 now only 103, however we would like to have better reduction for cholesterol.  I suggest to add Zetia 10 mg daily to her medical regimen, fasting lipid profile, AST ALT need to be done in 6 weeks

## 2021-12-04 NOTE — Telephone Encounter (Signed)
Patient informed of results. She will add zetia 10 mg daily and and repeat labs in 6 weeks.

## 2021-12-12 ENCOUNTER — Ambulatory Visit: Payer: Managed Care, Other (non HMO) | Admitting: Family Medicine

## 2021-12-15 ENCOUNTER — Ambulatory Visit: Payer: Managed Care, Other (non HMO) | Admitting: Family

## 2022-01-24 ENCOUNTER — Other Ambulatory Visit: Payer: Self-pay | Admitting: Cardiology

## 2022-02-13 ENCOUNTER — Ambulatory Visit (INDEPENDENT_AMBULATORY_CARE_PROVIDER_SITE_OTHER): Payer: Managed Care, Other (non HMO) | Admitting: Family Medicine

## 2022-02-13 ENCOUNTER — Encounter: Payer: Self-pay | Admitting: Family Medicine

## 2022-02-13 ENCOUNTER — Other Ambulatory Visit: Payer: Self-pay

## 2022-02-13 VITALS — BP 144/86 | HR 94 | Temp 97.5°F | Ht 64.0 in | Wt 194.6 lb

## 2022-02-13 DIAGNOSIS — E785 Hyperlipidemia, unspecified: Secondary | ICD-10-CM

## 2022-02-13 DIAGNOSIS — G8929 Other chronic pain: Secondary | ICD-10-CM

## 2022-02-13 DIAGNOSIS — I6521 Occlusion and stenosis of right carotid artery: Secondary | ICD-10-CM | POA: Diagnosis not present

## 2022-02-13 DIAGNOSIS — Z114 Encounter for screening for human immunodeficiency virus [HIV]: Secondary | ICD-10-CM

## 2022-02-13 DIAGNOSIS — I10 Essential (primary) hypertension: Secondary | ICD-10-CM | POA: Diagnosis not present

## 2022-02-13 DIAGNOSIS — I700 Atherosclerosis of aorta: Secondary | ICD-10-CM | POA: Diagnosis not present

## 2022-02-13 DIAGNOSIS — I2510 Atherosclerotic heart disease of native coronary artery without angina pectoris: Secondary | ICD-10-CM | POA: Diagnosis not present

## 2022-02-13 DIAGNOSIS — T8543XA Leakage of breast prosthesis and implant, initial encounter: Secondary | ICD-10-CM | POA: Insufficient documentation

## 2022-02-13 DIAGNOSIS — Z1231 Encounter for screening mammogram for malignant neoplasm of breast: Secondary | ICD-10-CM

## 2022-02-13 DIAGNOSIS — M5116 Intervertebral disc disorders with radiculopathy, lumbar region: Secondary | ICD-10-CM

## 2022-02-13 DIAGNOSIS — Z1159 Encounter for screening for other viral diseases: Secondary | ICD-10-CM

## 2022-02-13 DIAGNOSIS — M542 Cervicalgia: Secondary | ICD-10-CM

## 2022-02-13 DIAGNOSIS — M549 Dorsalgia, unspecified: Secondary | ICD-10-CM

## 2022-02-13 DIAGNOSIS — I517 Cardiomegaly: Secondary | ICD-10-CM | POA: Insufficient documentation

## 2022-02-13 DIAGNOSIS — Z1211 Encounter for screening for malignant neoplasm of colon: Secondary | ICD-10-CM

## 2022-02-13 DIAGNOSIS — Z8619 Personal history of other infectious and parasitic diseases: Secondary | ICD-10-CM | POA: Insufficient documentation

## 2022-02-13 DIAGNOSIS — R5383 Other fatigue: Secondary | ICD-10-CM

## 2022-02-13 DIAGNOSIS — F419 Anxiety disorder, unspecified: Secondary | ICD-10-CM | POA: Insufficient documentation

## 2022-02-13 DIAGNOSIS — F17209 Nicotine dependence, unspecified, with unspecified nicotine-induced disorders: Secondary | ICD-10-CM | POA: Insufficient documentation

## 2022-02-13 NOTE — Progress Notes (Signed)
[facility] [address] [place] [address] Dept: [phone] Dept Fax: [phone]  New Patient Office Visit  Subjective:    Patient ID: Samantha Russo, female    DOB: [birth year], 61 y.o..   MRN: [id number]  Chief Complaint  Patient presents with   Establish Care    NP- establish care.     History of Present Illness:  Patient is in today to establish care. Ms. Samantha Russo was born in [place], [place]. Her family moved ot [place] when she was 65. She attended [address] of [place] with a degree in Lobbyist. 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 She and her husband of 28 years moved to [facility] in July 2021 to reduce travel for his work and to be closer to family. Ms. Samantha Russo is now retired. She has 2 step-children who still live in the [facility] area. Ms. Samantha Russo smokes about 4 cigarettes a week. She notes this is down from about 1/2 ppd. She decreased after learning about her CAD and carotid disease. She does have a goal to quit. She drinks about 4 glasses of wine per week. She denies any drug use.  Ms. Samantha Russo has a history of CAD, apparently with a 100% occlusion of her RCA. 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 She has hyperlipidemia and hypertension. She has had hypertension for some years. She notes that this was previously felt to be exacerbated by chronic low back pain that she has struggled with. Her carotid disease was discovered after she had an episode of amaurosis fugax int he right eye. She is currently on aspirin and Plavix related to her heart disease. She is on rosuvastatin and ezetimibe for her lipid management. She has nitroglycerin spray available, but notes this is for times when her blood pressure surges rather than for any anginal symptoms. She is also on nebivolol.  Ms. Samantha Russo has a  history of a herniated disc in her lumbar spine and cervical disc disease. She apparently has both upper and lower radiculopathies. She also describes "neuropathy" that she struggles with. She notes chronic pain associated with this.  Ms. Samantha Russo notes she has a history of recurrent bouts of shingles and some nerve damage associated with these outbreaks. She states she has had Shingrix, but is unsure of dates.  Ms. Samantha Russo has a history of anxiety. She notes that about once a month, she takes a Valium to manage this. She feels her health issues in the past 6 months have fueled this.  Ms. Samantha Russo notes she has had prior breast implants. She states one of the implants has ruptured. She has some chronic aching in this area. She feels this needs to be removed, but is nervous about having the surgery in light of her cardiovascular issues.  Ms. Samantha Russo complains of a number of different symptoms that are worrisome to her. She notes chronic headaches, esp. in the right parietal and occipital area. She has neuropathy/sciatica, worse on the left than the right. She notes a chronic burning sensation in her stomach. She had been taking OTC antacids, but someone told her that would interfere with some of her medications. She notes she awakens each day with a very dry mouth. She notes she may have a blister int he back of her throat and notes she saw a white patch int his area. She denies  any increased thirst, though she drinks water very regularly. She has experienced 45 lbs of weight gain and feels like there must be something contributing to this. She admits that she has not been very active at all, though she did recently apply to join [employer]. Most disconcerting to her is a profound sense of fatigue.  Past Medical History: Patient Active Problem List   Diagnosis Date Noted   History of shingles, recurrent 02/13/2022   Anxiety 02/13/2022   Chronic headaches 02/13/2022   Fatigue 02/13/2022   Tobacco use  disorder, continuous 02/13/2022   Carotid arterial disease (HCC) 08/23/2021   Dyslipidemia 08/23/2021   Amaurosis fugax of right eye 08/18/2021   Coronary artery disease completely occluded RCA with nonobstructive disease elsewhere based on coronary CT 08/08/2021   Atypical chest pain 07/25/2021   Essential hypertension 07/24/2021   Chronic neck and back pain 07/24/2021   Greater trochanteric bursitis 03/11/2019   Cervical disc disorder with radiculopathy 08/09/2015   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/09/2015   Past Surgical History:  Procedure Laterality Date   PLACEMENT OF BREAST IMPLANTS     SPINAL FUSION     2012 2013   Family History  Problem Relation Age of Onset   Hyperlipidemia Mother    Hypertension Mother    Hyperlipidemia Father    Heart disease Father    Heart failure Father    Diabetes Father    Heart disease Maternal Aunt    Heart disease Maternal Grandmother    Stroke Maternal Grandmother    Heart failure Maternal Grandmother    Heart disease Maternal Grandfather    Diabetes Paternal Grandmother    Stroke Paternal Grandmother    Heart disease Paternal Grandfather    Outpatient Medications Prior to Visit  Medication Sig Dispense Refill   aspirin EC 81 MG tablet Take 1 tablet (81 mg total) by mouth daily. Swallow whole. 90 tablet 3   clopidogrel (PLAVIX) 75 MG tablet Take 1 tablet (75 mg total) by mouth daily. 90 tablet 0   diazepam (VALIUM) 5 MG tablet Take 1 tablet (5 mg total) by mouth daily as needed for anxiety. 20 tablet 0   ezetimibe (ZETIA) 10 MG tablet Take 1 tablet (10 mg total) by mouth daily. 90 tablet 1   nebivolol (BYSTOLIC) 5 MG tablet TAKE 1 TABLET BY MOUTH EVERY DAY 90 tablet 1   nitroGLYCERIN (NITROLINGUAL) 0.4 MG/SPRAY spray Place 1 spray under the tongue every 5 (five) minutes x 3 doses as needed for chest pain (Do not exceed 3 times in a day and Call 911 if chest pain continues or worsening). 12 g 12    rosuvastatin (CRESTOR) 40 MG tablet Take 1 tablet (40 mg total) by mouth daily. 90 tablet 3   spironolactone (ALDACTONE) 25 MG tablet TAKE 1 TABLET BY MOUTH DAILY 90 tablet 1   vitamin C (ASCORBIC ACID) 500 MG tablet Take 500 mg by mouth daily.     Vitamin D, Ergocalciferol, (DRISDOL) 1.25 MG (50000 UNIT) CAPS capsule Take 50,000 Units by mouth every 3 (three) days.     No facility-administered medications prior to visit.   No Known Allergies    Objective:   Today's Vitals   02/13/22 0919  BP: (!) 144/86  Pulse: 94  Temp: (!) 97.5 F (36.4 C)  TempSrc: Temporal  SpO2: 98%  Weight: 194 lb 9.6 oz (88.3 kg)  Height:  (1.626 m)   Body mass index is 33.4 kg/m.   General:  Well developed, well nourished. No acute distress. HEENT: Mucous membranes moist. Oropharynx clear. Good dentition. Psych: Alert and oriented. Normal mood and affect.  Health Maintenance Due  Topic Date Due   COVID-19 Vaccine (1) Never done   Hepatitis C Screening  Never done   TETANUS/TDAP  Never done   PAP SMEAR-Modifier  Never done   COLONOSCOPY (Pts 45-65yrs Insurance coverage will need to be confirmed)  Never done   MAMMOGRAM  Never done   Zoster Vaccines- Shingrix (1 of 2) Never done   INFLUENZA VACCINE  Never done   Lab Results Last CBC Lab Results  Component Value Date   WBC 9.3 11/18/2021   HGB 14.2 11/18/2021   HCT 42.5 11/18/2021   MCV 91.2 11/18/2021   MCH 30.5 11/18/2021   RDW 14.4 11/18/2021   PLT 324 11/18/2021   Last metabolic panel Lab Results  Component Value Date   GLUCOSE 86 11/18/2021   NA 137 11/18/2021   K 4.4 11/18/2021   CL 106 11/18/2021   CO2 19 (L) 11/18/2021   BUN 11 11/18/2021   CREATININE 0.58 11/18/2021   GFRNONAA >60 11/18/2021   CALCIUM 8.9 11/18/2021   PROT 7.4 11/30/2021   ALBUMIN 4.9 11/30/2021   BILITOT 0.2 11/30/2021   ALKPHOS 134 (H) 11/30/2021   AST 22 11/30/2021   ALT 31 11/30/2021   ANIONGAP 12 11/18/2021   Last lipids Lab Results   Component Value Date   CHOL 185 11/30/2021   HDL 56 11/30/2021   LDLCALC 103 (H) 11/30/2021   TRIG 149 11/30/2021   CHOLHDL 3.3 11/30/2021   Imaging: CT of Head wo contrast (11/18/2021) IMPRESSION: No acute intracranial abnormality.  Coronary CT (10/25/2021) FINDINGS: 1. Left Main: 0.96; no significant stenosis. 2. Proximal LAD: 0.94; no significant stenosis. 3. Mid LAD: 0.82; no significant stenosis. 4. D1: 0.83; no significant stenosis. 5. LCX: 0.91 no significant stenosis. 6. OM1: 0.86; no significant stenosis. 7. RCA: Not modeled as occluded (100%).   IMPRESSION: 1.  No significant stenoses by CT FFR. 2.  OM2 negative (CT FFR = 0.86). 3.  RCA not modeled as occluded (100%).  CT Angio Head w and wo contrast (08/19/2021) CT Angio of Neck IMPRESSION: 1. Right ICA occlusion at the skull base. Severe stenosis of the distal cervical right ICA. 2. Cerebral arteries are patent. 3. 50% stenosis of the proximal right internal carotid artery secondary to mixed density atherosclerosis.   Aortic Atherosclerosis (ICD10-I70.0).  Echocardiogram (08/07/2021) IMPRESSIONS   1. Left ventricular ejection fraction, by estimation, is 55 to 60%. The left ventricle has normal function. The left ventricle has no regional wall motion abnormalities. There is moderate left ventricular hypertrophy.  Left ventricular diastolic parameters were normal.   2. Right ventricular systolic function is normal. The right ventricular size is normal. Tricuspid regurgitation signal is inadequate for assessing PA pressure.   3. A small pericardial effusion is present.   4. The mitral valve is normal in structure. No evidence of mitral valve regurgitation. No evidence of mitral stenosis.   5. The aortic valve was not well visualized. Aortic valve regurgitation is not visualized. No aortic stenosis is present.   6. The inferior vena cava is normal in size with greater than 50% respiratory variability, suggesting  right atrial pressure of 3 mmHg.   Assessment & Plan:   1. Essential hypertension Ms. Agostini's blood pressure is mildly high today. If this remains elevated, she may need addition of another antihypertensive beyond a beta-blocker.  2.  Coronary artery disease involving native coronary artery of native heart without angina pectoris Reviewed cardiology consult notes. Currently on DAPT and lipid management. Working on tobacco cessation. Looking to [facility] exercise program at [facility].  3. Occlusion of right carotid artery Reviewed vascular surgery notes. Currently on DAPT and lipid management. Working on tobacco cessation.  4. Herniation of lumbar intervertebral disc with radiculopathy 5. Chronic neck and back pain By history, has significant cervical and lumbar disc disease. I agree with plan to obtain prior records form [place].  6. Dyslipidemia Apparently has had LDL cholesterol in 190's. Significantly lower on last lipids. If this persists, she may need referral to lipid clinic to discuss consideration of Repatha.  7. Fatigue, unspecified type Ms. Satcher complains of significant fatigue ongoing for some time now. I will assess basic labs to assess for common endocrine, metabolic, hematologic, and infectious causes. I will have her return in 1 month to review results and allow more time to do a fuller assessment of this issue. - Hemoglobin A1c - Comprehensive metabolic panel - TSH - T4, free - CBC with Differential/Platelet - VITAMIN D 25 Hydroxy (Vit-D Deficiency, Fractures)  8. Tobacco use disorder, continuous Strongly encouraged continued efforts to stop all tobacco use. Current use is low enough that NRT would not be indicated.  9. Encounter for hepatitis C screening test for low risk patient  - HCV Ab w Reflex to Quant PCR  10. Screening for HIV (human immunodeficiency virus)  - HIV Antibody (routine testing w rflx)  11. Encounter for screening mammogram for  malignant neoplasm of breast  - MM DIGITAL SCREENING BILATERAL; Future  12. Screening for colon cancer  - Ambulatory referral to Gastroenterology  Loyola Mast, MD

## 2022-02-20 ENCOUNTER — Other Ambulatory Visit: Payer: Managed Care, Other (non HMO)

## 2022-02-23 ENCOUNTER — Other Ambulatory Visit: Payer: Managed Care, Other (non HMO)

## 2022-03-01 ENCOUNTER — Inpatient Hospital Stay (HOSPITAL_COMMUNITY): Admission: RE | Admit: 2022-03-01 | Payer: Managed Care, Other (non HMO) | Source: Ambulatory Visit

## 2022-03-01 ENCOUNTER — Ambulatory Visit: Payer: Managed Care, Other (non HMO) | Admitting: Vascular Surgery

## 2022-03-13 ENCOUNTER — Ambulatory Visit: Payer: Managed Care, Other (non HMO) | Admitting: Family Medicine

## 2022-03-15 ENCOUNTER — Encounter (HOSPITAL_COMMUNITY): Payer: Managed Care, Other (non HMO)

## 2022-03-15 ENCOUNTER — Ambulatory Visit: Payer: Managed Care, Other (non HMO) | Admitting: Vascular Surgery

## 2022-03-17 ENCOUNTER — Telehealth: Payer: Managed Care, Other (non HMO) | Admitting: Physician Assistant

## 2022-03-17 ENCOUNTER — Encounter: Payer: Self-pay | Admitting: Physician Assistant

## 2022-03-17 DIAGNOSIS — U071 COVID-19: Secondary | ICD-10-CM | POA: Diagnosis not present

## 2022-03-17 MED ORDER — MOLNUPIRAVIR EUA 200MG CAPSULE: 4.0000 | ORAL_CAPSULE | Freq: Two times a day (BID) | ORAL | 0 refills | Status: AC

## 2022-03-17 NOTE — Progress Notes (Signed)
?Virtual Visit Consent  ? ?Samantha Russo, you are scheduled for a virtual visit with a [facility] provider today.   ?  ?Just as with appointments in the office, your consent must be obtained to participate.  Your consent will be active for this visit and any virtual visit you may have with one of our providers in the next 365 days.   ?  ?If you have a MyChart account, a copy of this consent can be sent to you electronically.  All virtual visits are billed to your insurance company just like a traditional visit in the office.   ? ?As this is a virtual visit, video technology does not allow for your provider to perform a traditional examination.  This may limit your provider's ability to fully assess your condition.  If your provider identifies any concerns that need to be evaluated in person or the need to arrange testing (such as labs, EKG, etc.), we will make arrangements to do so.   ?  ?Although advances in technology are sophisticated, we cannot ensure that it will always work on either your end or our end.  If the connection with a video visit is poor, the visit may have to be switched to a telephone visit.  With either a video or telephone visit, we are not always able to ensure that we have a secure connection.    ? ?I need to obtain your verbal consent now.   Are you willing to proceed with your visit today?  ?  ?Samantha Russo has provided verbal consent on 03/17/2022 for a virtual visit (video or telephone). ?  ?Jarold Motto, PA  ? ?Date: 03/17/2022 5:03 PM ? ? ?Virtual Visit via Video Note  ? ?IJarold Motto, connected with  Samantha Russo  ([id number], 02-03-1961) on 03/17/22 at  4:45 PM EDT by a video-enabled telemedicine application and verified that I am speaking with the correct person using two identifiers. ? ?Location: ?Patient: Virtual Visit Location Patient: Home ?Provider: Virtual Visit Location Provider: Home Office ?  ?I discussed the limitations of evaluation and management  by telemedicine and the availability of in person appointments. The patient expressed understanding and agreed to proceed.   ? ?History of Present Illness: ?Samantha Russo is a 61 y.o. who identifies as a female who was assigned female at birth, and is being seen today for COVID-19. ? ?COVID-19 ?Symptoms started 4 days ago. Started with congestion, sore throat -- she took a few COVID tests at the beginning and did not test positive. Her husband is currently COVID-19 positive and on day 4/5 of paxlovid. ? ?Today, she tested positive. She is currently having sifnificant chills, moderate to severe HA, dizziness. Eating and drinking okay. She is unvaccinated, and has never had COVID-19. She is a current smoker. Hx of heart attack and TIA in recent years. Followed closely by cardiology. ? ?Max temp: 100. Temp now is normal. ?Oxygen is 98% ?HR is 113 ?Blood pressure:  ? ?Denies unusual SOB or chest pain for her sx. ? ?HPI: HPI  ?Problems:  ?Patient Active Problem List  ? Diagnosis Date Noted  ? History of shingles, recurrent 02/13/2022  ? Anxiety 02/13/2022  ? Chronic headaches 02/13/2022  ? Fatigue 02/13/2022  ? Tobacco use disorder, continuous 02/13/2022  ? Aortic atherosclerosis (HCC) 02/13/2022  ? Left ventricular hypertrophy 02/13/2022  ? Breast implant leak 02/13/2022  ? Carotid arterial disease (HCC) 08/23/2021  ? Dyslipidemia 08/23/2021  ? Amaurosis fugax of right eye 08/18/2021  ?  Coronary artery disease completely occluded RCA with nonobstructive disease elsewhere based on coronary CT 08/08/2021  ? Atypical chest pain 07/25/2021  ? Essential hypertension 07/24/2021  ? Chronic neck and back pain 07/24/2021  ? Greater trochanteric bursitis 03/11/2019  ? Cervical disc disorder with radiculopathy 08/09/2015  ? 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/09/2015  ?  ?Allergies: No Known Allergies ?Medications:  ?Current Outpatient Medications:  ?  molnupiravir EUA (LAGEVRIO) 200 mg CAPS  capsule, Take 4 capsules (800 mg total) by mouth 2 (two) times daily for 5 days., Disp: 40 capsule, Rfl: 0 ?  aspirin EC 81 MG tablet, Take 1 tablet (81 mg total) by mouth daily. Swallow whole., Disp: 90 tablet, Rfl: 3 ?  clopidogrel (PLAVIX) 75 MG tablet, Take 1 tablet (75 mg total) by mouth daily., Disp: 90 tablet, Rfl: 0 ?  diazepam (VALIUM) 5 MG tablet, Take 1 tablet (5 mg total) by mouth daily as needed for anxiety., Disp: 20 tablet, Rfl: 0 ?  ezetimibe (ZETIA) 10 MG tablet, Take 1 tablet (10 mg total) by mouth daily., Disp: 90 tablet, Rfl: 1 ?  nebivolol (BYSTOLIC) 5 MG tablet, TAKE 1 TABLET BY MOUTH EVERY DAY, Disp: 90 tablet, Rfl: 1 ?  nitroGLYCERIN (NITROLINGUAL) 0.4 MG/SPRAY spray, Place 1 spray under the tongue every 5 (five) minutes x 3 doses as needed for chest pain (Do not exceed 3 times in a day and Call 911 if chest pain continues or worsening)., Disp: 12 g, Rfl: 12 ?  rosuvastatin (CRESTOR) 40 MG tablet, Take 1 tablet (40 mg total) by mouth daily., Disp: 90 tablet, Rfl: 3 ?  spironolactone (ALDACTONE) 25 MG tablet, TAKE 1 TABLET BY MOUTH DAILY, Disp: 90 tablet, Rfl: 1 ?  vitamin C (ASCORBIC ACID) 500 MG tablet, Take 500 mg by mouth daily., Disp: , Rfl:  ?  Vitamin D, Ergocalciferol, (DRISDOL) 1.25 MG (50000 UNIT) CAPS capsule, Take 50,000 Units by mouth every 3 (three) days., Disp: , Rfl:  ? ?Observations/Objective: ?Patient is well-developed, well-nourished in no acute distress.  ?Resting comfortably  at home.  ?Head is normocephalic, atraumatic.  ?No labored breathing.  ?Speech is clear and coherent with logical content.  ?Patient is alert and oriented at baseline.  ? ? ?Assessment and Plan: ?1. COVID-19 ?Patient has a respiratory illness without signs of acute distress or respiratory compromise at this time.  ?Discussed anti-virals. ?Given unvaccinated status, use of certain chronic medical issues, and health history recommend molnupiravir. She is agreeable. ?Mychart message sent with list of red  flags. ?Low threshold to go to the ER given unvaccinated status and heart history. ?Recommend close follow-up with PCP next week to check in on sx. ? ?Follow Up Instructions: ?I discussed the assessment and treatment plan with the patient. The patient was provided an opportunity to ask questions and all were answered. The patient agreed with the plan and demonstrated an understanding of the instructions.  A copy of instructions were sent to the patient via MyChart unless otherwise noted below.  ? ?The patient was advised to call back or seek an in-person evaluation if the symptoms worsen or if the condition fails to improve as anticipated. ? ?Time:  ?I spent 5-10 minutes with the patient via telehealth technology discussing the above problems/concerns.   ? ?Jarold Motto, PA ? ?

## 2022-04-02 NOTE — Progress Notes (Deleted)
? ?NEUROLOGY FOLLOW UP OFFICE NOTE ? ?Samantha Russo ?[id number] ? ?Assessment/Plan:  ? ?Right amaurosis fugax secondary to right internal carotid artery occlusion ?Hypertension ?Hyperlipidemia ?Tobacco use disorder ?Coronary artery disease ?  ?1)  Secondary stroke prevention as managed by PCP and cardiology: ?- Plavix 75mg  daily. ?- Statin therapy.  LDL goal less than 70 ?- Hgb A1c goal less than 7 ?- Normotensive blood pressure ?2)  Smoking cessation - already trying to quit. ?3)  Mediterranean diet ?4)  Routine exercise. ?5)  Follow up as needed ?  ?  ?Subjective:  ?Samantha Russo is a 61 year old right-handed female with CAD, HTN, tobacco use disorder and chronic neck and back pain who follows up for right amaurosis fugax. ? ?UPDATE: ?Current medications:  Plavix 75mg  daily, *** ?  ?HISTORY: ?In July 2022, she started feeling chest pain, fatigue and dizziness.  She didn't immediately see the doctor but later had an EKG which showed probable MI.  She was started on ASA 81mg  daily.  One day in August, she woke up feeling dizzy and lost vision in her right eye for 20 seconds.  A couple of weeks later, she was driving and again lost vision in right eye with dizziness.  Carotid ultrasound on 08/18/2021 demonstrated severe proximal right ICA stenosis.  She was going to undergo intervention but follow up CTA of head and neck on 08/19/2021 demonstrated complete occlusion of the right ICA at the base and, therefore, surgery was not an option.  Plavix was added and Crestor was increased.  Since then, she still feels dizzy at times.  She is anxious about the possibility of another stroke ?  ?Lipid panel demonstrated total chol of 299, TG 232, HDL 56 and LDL 198.  Serum glucose was 107.  Echocardiogram on 08/07/2021 showed EF 55-60% with moderate left ventricular hypertrophy but no regional wall motion abnormalities or IAS/shunts.   ?  ? ?PAST MEDICAL HISTORY: ?Past Medical History:  ?Diagnosis Date  ? Atypical chest  pain 07/25/2021  ? Carotid artery occlusion   ? Cervical disc disorder with radiculopathy 08/09/2015  ? Chronic neck and back pain   ? Heart attack (HCC)   ? Hypertension   ? Smoking 07/25/2021  ? TIA (transient ischemic attack)   ? ? ?MEDICATIONS: ?Current Outpatient Medications on File Prior to Visit  ?Medication Sig Dispense Refill  ? aspirin EC 81 MG tablet Take 1 tablet (81 mg total) by mouth daily. Swallow whole. 90 tablet 3  ? clopidogrel (PLAVIX) 75 MG tablet Take 1 tablet (75 mg total) by mouth daily. 90 tablet 0  ? diazepam (VALIUM) 5 MG tablet Take 1 tablet (5 mg total) by mouth daily as needed for anxiety. 20 tablet 0  ? ezetimibe (ZETIA) 10 MG tablet Take 1 tablet (10 mg total) by mouth daily. 90 tablet 1  ? nebivolol (BYSTOLIC) 5 MG tablet TAKE 1 TABLET BY MOUTH EVERY DAY 90 tablet 1  ? nitroGLYCERIN (NITROLINGUAL) 0.4 MG/SPRAY spray Place 1 spray under the tongue every 5 (five) minutes x 3 doses as needed for chest pain (Do not exceed 3 times in a day and Call 911 if chest pain continues or worsening). 12 g 12  ? rosuvastatin (CRESTOR) 40 MG tablet Take 1 tablet (40 mg total) by mouth daily. 90 tablet 3  ? spironolactone (ALDACTONE) 25 MG tablet TAKE 1 TABLET BY MOUTH DAILY 90 tablet 1  ? vitamin C (ASCORBIC ACID) 500 MG tablet Take 500 mg by mouth daily.    ?  Vitamin D, Ergocalciferol, (DRISDOL) 1.25 MG (50000 UNIT) CAPS capsule Take 50,000 Units by mouth every 3 (three) days.    ? ?No current facility-administered medications on file prior to visit.  ? ? ?ALLERGIES: ?No Known Allergies ? ?FAMILY HISTORY: ?Family History  ?Problem Relation Age of Onset  ? Hyperlipidemia Mother   ? Hypertension Mother   ? Hyperlipidemia Father   ? Heart disease Father   ? Heart failure Father   ? Diabetes Father   ? Heart disease Maternal Aunt   ? Heart disease Maternal Grandmother   ? Stroke Maternal Grandmother   ? Heart failure Maternal Grandmother   ? Heart disease Maternal Grandfather   ? Diabetes Paternal  Grandmother   ? Stroke Paternal Grandmother   ? Heart disease Paternal Grandfather   ? ? ?  ?Objective:  ?*** ?General: No acute distress.  Patient appears ***-groomed.   ?Head:  Normocephalic/atraumatic ?Eyes:  Fundi examined but not visualized ?Neck: supple, no paraspinal tenderness, full range of motion ?Heart:  Regular rate and rhythm ?Lungs:  Clear to auscultation bilaterally ?Back: No paraspinal tenderness ?Neurological Exam: alert and oriented to person, place, and time.  Speech fluent and not dysarthric, language intact.  CN II-XII intact. Bulk and tone normal, muscle strength 5/5 throughout.  Sensation to light touch intact.  Deep tendon reflexes 2+ throughout, toes downgoing.  Finger to nose testing intact.  Gait normal, Romberg negative. ? ? ?Shon Millet, DO ? ?CC: *** ? ? ? ? ? ? ?

## 2022-04-03 ENCOUNTER — Ambulatory Visit: Payer: Managed Care, Other (non HMO) | Admitting: Neurology

## 2022-07-06 ENCOUNTER — Other Ambulatory Visit: Payer: Self-pay | Admitting: Cardiology

## 2022-07-12 NOTE — Progress Notes (Deleted)
NEUROLOGY FOLLOW UP OFFICE NOTE  Samantha Russo [id number]  Assessment/Plan:   Right amaurosis fugax secondary to right internal carotid artery occlusion Hypertension Hyperlipidemia Tobacco use disorder Coronary artery disease   1)  Secondary stroke prevention as managed by PCP and cardiology: - Continue DAPT for another 2 months.  On 11/19, she may discontinue ASA and continue Plavix 75mg  daily. - Statin therapy.  LDL goal less than 70 - Hgb A1c goal less than 7 - Normotensive blood pressure 2)  Smoking cessation - already trying to quit. 3)  Mediterranean diet 4)  Routine exercise. 5)  Follow up as needed.     Subjective:  Samantha Russo is a 61 year old right-handed female with CAD, HTN, tobacco use disorder and chronic neck and back pain who follows up for right amaurosis fugax.    UPDATE: Current medications:  Plavix 75mg , ***   HISTORY: In July 2022, she started feeling chest pain, fatigue and dizziness.  She didn't immediately see the doctor but later had an EKG which showed probable MI.  She was started on ASA 81mg  daily.  The following month, she woke up feeling dizzy and lost vision in her right eye for 20 seconds.  A couple of weeks later, she was driving and again lost vision in right eye with dizziness.  Carotid ultrasound on 08/18/2021 demonstrated severe proximal right ICA stenosis.  She was going to undergo intervention but follow up CTA of head and neck on 08/19/2021 demonstrated complete occlusion of the right ICA at the base and, therefore, surgery was not an option.  Plavix was added and Crestor was increased.  Since then, she still feels dizzy at times.  She is anxious about the possibility of another stroke   Lipid panel in July demonstrated total chol of 299, TG 232, HDL 56 and LDL 198.  Serum glucose was 107.  Echocardiogram on 08/07/2021 showed EF 55-60% with moderate left ventricular hypertrophy but no regional wall motion abnormalities or IAS/shunts.      Put on ASA 81mg  and Plavix -    Feeling off balance, feels like dripping on back of brain - fatigue  PAST MEDICAL HISTORY: Past Medical History:  Diagnosis Date   Atypical chest pain 07/25/2021   Carotid artery occlusion    Cervical disc disorder with radiculopathy 08/09/2015   Chronic neck and back pain    Heart attack (HCC)    Hypertension    Smoking 07/25/2021   TIA (transient ischemic attack)     MEDICATIONS: Current Outpatient Medications on File Prior to Visit  Medication Sig Dispense Refill   aspirin EC 81 MG tablet Take 1 tablet (81 mg total) by mouth daily. Swallow whole. 90 tablet 3   clopidogrel (PLAVIX) 75 MG tablet Take 1 tablet (75 mg total) by mouth daily. 90 tablet 0   diazepam (VALIUM) 5 MG tablet Take 1 tablet (5 mg total) by mouth daily as needed for anxiety. 20 tablet 0   ezetimibe (ZETIA) 10 MG tablet TAKE 1 TABLET(10 MG) BY MOUTH DAILY 90 tablet 1   nebivolol (BYSTOLIC) 5 MG tablet TAKE 1 TABLET BY MOUTH EVERY DAY 90 tablet 1   nitroGLYCERIN (NITROLINGUAL) 0.4 MG/SPRAY spray Place 1 spray under the tongue every 5 (five) minutes x 3 doses as needed for chest pain (Do not exceed 3 times in a day and Call 911 if chest pain continues or worsening). 12 g 12   rosuvastatin (CRESTOR) 40 MG tablet Take 1 tablet (40 mg total) by  mouth daily. 90 tablet 3   spironolactone (ALDACTONE) 25 MG tablet TAKE 1 TABLET BY MOUTH DAILY 90 tablet 1   vitamin C (ASCORBIC ACID) 500 MG tablet Take 500 mg by mouth daily.     Vitamin D, Ergocalciferol, (DRISDOL) 1.25 MG (50000 UNIT) CAPS capsule Take 50,000 Units by mouth every 3 (three) days.     No current facility-administered medications on file prior to visit.    ALLERGIES: No Known Allergies  FAMILY HISTORY: Family History  Problem Relation Age of Onset   Hyperlipidemia Mother    Hypertension Mother    Hyperlipidemia Father    Heart disease Father    Heart failure Father    Diabetes Father    Heart disease Maternal  Aunt    Heart disease Maternal Grandmother    Stroke Maternal Grandmother    Heart failure Maternal Grandmother    Heart disease Maternal Grandfather    Diabetes Paternal Grandmother    Stroke Paternal Grandmother    Heart disease Paternal Grandfather       Objective:  *** General: No acute distress.  Patient appears well-groomed.   Head:  Normocephalic/atraumatic Eyes:  Fundi examined but not visualized Neck: supple, no paraspinal tenderness, full range of motion Heart:  Regular rate and rhythm Neurological Exam: alert and oriented to person, place, and time.  Speech fluent and not dysarthric, language intact.  CN II-XII intact. Bulk and tone normal, muscle strength 5/5 throughout.  Sensation to light touch intact.  Deep tendon reflexes 2+ throughout, toes downgoing.  Finger to nose testing intact.  Gait normal, Romberg negative.   Shon Millet, DO  CC: Herbie Drape, MD

## 2022-07-13 ENCOUNTER — Ambulatory Visit: Payer: Managed Care, Other (non HMO) | Admitting: Neurology

## 2022-08-12 ENCOUNTER — Other Ambulatory Visit: Payer: Self-pay | Admitting: Cardiology

## 2022-08-13 NOTE — Telephone Encounter (Signed)
Nitro Spray , Spironolactone 25 mg # 90, Nebivolol 5 mg # 90 only with message patient needs appointment for future refills sent to  [id number] - [place], [place] - [address] AT [address]

## 2022-08-23 ENCOUNTER — Other Ambulatory Visit: Payer: Self-pay | Admitting: Cardiology

## 2022-08-28 ENCOUNTER — Other Ambulatory Visit: Payer: Self-pay | Admitting: Cardiology

## 2022-08-29 ENCOUNTER — Telehealth: Payer: Self-pay | Admitting: Cardiology

## 2022-08-29 NOTE — Telephone Encounter (Signed)
Pt c/o of Chest Pain: STAT if CP now or developed within 24 hours  1. Are you having CP right now? No  2. Are you experiencing any other symptoms (ex. SOB, nausea, vomiting, sweating)?  Nose bleeds  3. How long have you been experiencing CP?  For the last month  4. Is your CP continuous or coming and going?  Coming and going  5. Have you taken Nitroglycerin?  Yes  Patient stated she has a pain in her chest area but she feels the pain in her back.  Patient has appointment scheduled with Dr. Bing Matter on 09/06/22. ?

## 2022-08-29 NOTE — Telephone Encounter (Signed)
LVM for pt to call back. Left nurse desk phone number.

## 2022-08-30 ENCOUNTER — Other Ambulatory Visit: Payer: Self-pay | Admitting: Cardiology

## 2022-08-30 MED ORDER — ROSUVASTATIN CALCIUM 40 MG PO TABS: ORAL_TABLET | ORAL | 0 refills | Status: DC

## 2022-08-30 NOTE — Addendum Note (Signed)
Addended by: Baldo Ash D on: 08/30/2022 03:21 PM   Modules accepted: Orders

## 2022-08-30 NOTE — Telephone Encounter (Signed)
Spoke with pt. She stated that she has an appt on 09-06-22 and will see Dr. Bing Matter then to discuss her symptoms. She is aware if her symptoms worsen or persists she should call 911 or go to the ER.

## 2022-09-06 ENCOUNTER — Ambulatory Visit: Payer: Managed Care, Other (non HMO) | Attending: Cardiology | Admitting: Cardiology

## 2022-09-06 ENCOUNTER — Encounter: Payer: Self-pay | Admitting: Cardiology

## 2022-09-06 VITALS — BP 140/88 | HR 96 | Ht 63.0 in | Wt 197.0 lb

## 2022-09-06 DIAGNOSIS — R0789 Other chest pain: Secondary | ICD-10-CM

## 2022-09-06 DIAGNOSIS — I10 Essential (primary) hypertension: Secondary | ICD-10-CM | POA: Diagnosis not present

## 2022-09-06 DIAGNOSIS — E785 Hyperlipidemia, unspecified: Secondary | ICD-10-CM | POA: Diagnosis not present

## 2022-09-06 DIAGNOSIS — I25118 Atherosclerotic heart disease of native coronary artery with other forms of angina pectoris: Secondary | ICD-10-CM | POA: Diagnosis not present

## 2022-09-06 DIAGNOSIS — R5383 Other fatigue: Secondary | ICD-10-CM

## 2022-09-06 DIAGNOSIS — I6521 Occlusion and stenosis of right carotid artery: Secondary | ICD-10-CM

## 2022-09-06 MED ORDER — ISOSORBIDE MONONITRATE ER 30 MG PO TB24: 30.0000 mg | ORAL_TABLET | Freq: Every day | ORAL | 3 refills | Status: DC

## 2022-09-06 NOTE — Patient Instructions (Addendum)
Medication Instructions:  Your physician has recommended you make the following change in your medication:   START: Imdur 30mg  1 daily by mouth    Lab Work: CMP, CBC, TSH, Lipid- Today 2nd Floor  [address] If you have labs (blood work) drawn today and your tests are completely normal, you will receive your results only by: MyChart Message (if you have MyChart) OR A paper copy in the mail If you have any lab test that is abnormal or we need to change your treatment, we will call you to review the results.   Testing/Procedures: None Ordered   Follow-Up: At [facility], you and your health needs are our priority.  As part of our continuing mission to provide you with exceptional heart care, we have created designated Provider Care Teams.  These Care Teams include your primary Cardiologist (physician) and Advanced Practice Providers (APPs -  Physician Assistants and Nurse Practitioners) who all work together to provide you with the care you need, when you need it.  We recommend signing up for the patient portal called "MyChart".  Sign up information is provided on this After Visit Summary.  MyChart is used to connect with patients for Virtual Visits (Telemedicine).  Patients are able to view lab/test results, encounter notes, upcoming appointments, etc.  Non-urgent messages can be sent to your provider as well.   To learn more about what you can do with MyChart, go to [facility].    Your next appointment:   Next time [email or web address] is here in [facility]- October  The format for your next appointment:   In Person  Provider:   November, MD    Other Instructions NA

## 2022-09-06 NOTE — Progress Notes (Unsigned)
Cardiology Office Note:    Date:  09/06/2022   ID:  Samantha Russo, DOB Jun 29, 1961, MRN [id number]  PCP:  Samantha Mast, MD  Cardiologist:  Gypsy Balsam, MD    Referring MD: Samantha Mast, MD   Chief Complaint  Patient presents with   Chest Pain   Fatigue   Epistaxis         Weight Gain    History of Present Illness:    Samantha Russo is a 61 y.o. female  with complex past medical history.  Apparently she relocated to our area from [place] she was referred to me because of episode of chest pain.  She does have multiple risk factors for coronary artery disease including hypertension, dyslipidemia very high LDL that initially was not treated smoking.  She did have echocardiogram done which showed normal left ventricle ejection fraction left-ventricular hypertrophy.  There were no segmental motion abnormalities, after that she did not coronary CT angio which showed completely occluded right coronary artery with nonobstructive mild to moderate disease elsewhere.  Decision has been made to treat her medically.  She was put on antiplatelet therapy as well as beta-blocker and statin.  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  However cervical and brain CT angiogram showed completely occluded right internal carotic artery at the base, therefore, surgery has not been indicated.  She also got normal antegrade flow within vertebral arteries. She comes today to my office for follow-up for about 2 to 3 months she experienced chest pain.  Interestingly chest pain does not happen  when she walks does not happen when she gets upset it happens typically at rest when she is sitting doing not much pain is located on the left side of her chest she takes nitroglycerin and she tells me takes about 20 minutes for nitroglycerin to relieve the pain.  There is no acceleration pattern of the pain.  At the same time she is able to walk climb stairs and does what she needs to do without any symptoms.  It is atypical chest pain.  Additional issue that she brought up is the fact that she does have epistaxis.  To the point that sometimes she have some blood clot coming from the left nostril.  She is taking dual antiplatelet therapy.  Another concern is her weight gain and fatigue and tiredness.  Past Medical History:  Diagnosis Date   Atypical chest pain 07/25/2021   Carotid artery occlusion    Cervical disc disorder with radiculopathy 08/09/2015   Chronic neck and back pain    Heart attack (HCC)    Hypertension    Smoking 07/25/2021   TIA (transient ischemic attack)     Past Surgical History:  Procedure Laterality Date   PLACEMENT OF BREAST IMPLANTS     SPINAL FUSION     2012 2013    Current Medications: Current Meds  Medication Sig   aspirin EC 81 MG tablet Take 1 tablet (81 mg total) by mouth daily. Swallow whole.   clopidogrel (PLAVIX) 75 MG tablet Take 1 tablet (75 mg  total) by mouth daily.   diazepam (VALIUM) 5 MG tablet Take 1 tablet (5 mg total) by mouth daily as needed for anxiety.   ezetimibe (ZETIA) 10 MG tablet TAKE 1 TABLET(10 MG) BY MOUTH DAILY (Patient taking differently: Take 10 mg by mouth daily.)   nebivolol (BYSTOLIC) 5 MG tablet Take 1 tablet (5 mg total) by mouth daily. Needs appointment for future refills / 1st attempt   nitroGLYCERIN (NITROLINGUAL) 0.4 MG/SPRAY spray Place 1 spray under the tongue every 5 (five) minutes x 3 doses as needed for chest pain. Needs appointment for future refills / 1st attempt   rosuvastatin (CRESTOR) 40 MG tablet TAKE 1  TABLET(40 MG) BY MOUTH DAILY (Patient taking differently: Take 40 mg by mouth daily. TAKE 1 TABLET(40 MG) BY MOUTH DAILY)   spironolactone (ALDACTONE) 25 MG tablet Take 1 tablet (25 mg total) by mouth daily. Needs appointment for future refills / 1st attempt   vitamin C (ASCORBIC ACID) 500 MG tablet Take 500 mg by mouth daily.   Vitamin D, Ergocalciferol, (DRISDOL) 1.25 MG (50000 UNIT) CAPS capsule Take 50,000 Units by mouth every 3 (three) days.     Allergies:   Patient has no known allergies.   Social History   Socioeconomic History   Marital status: Married    Spouse name: Not on file   Number of children: 0   Years of education: Not on file   Highest education level: Not on file  Occupational History   Occupation: Retired  Tobacco Use   Smoking status: Every Day    Packs/day: 0.10    Types: Cigarettes   Smokeless tobacco: Never   Tobacco comments:    Less than 4 cigs a week  Vaping Use   Vaping Use: Never used  Substance and Sexual Activity   Alcohol use: Yes    Alcohol/week: 4.0 standard drinks of alcohol    Types: 4 Glasses of wine per week   Drug use: Never   Sexual activity: Yes  Other Topics Concern   Not on file  Social History Narrative   2 step-children in the [place], [place]   Social Determinants of Health   Financial Resource Strain: Not on file  Food Insecurity: Not on file  Transportation Needs: Not on file  Physical Activity: Not on file  Stress: Not on file  Social Connections: Not on file     Family History: The patient's family history includes Diabetes in her father and paternal grandmother; Heart disease in her father, maternal aunt, maternal grandfather, maternal grandmother, and paternal grandfather; Heart failure in her father and maternal grandmother; Hyperlipidemia in her father and mother; Hypertension in her mother; Stroke in her maternal grandmother and paternal grandmother. ROS:   Please see the history of present illness.    All 14  point review of systems negative except as described per history of present illness  EKGs/Labs/Other Studies Reviewed:      Recent Labs: 11/18/2021: BUN 11; Creatinine, Ser 0.58; Hemoglobin 14.2; Platelets 324; Potassium 4.4; Sodium 137 11/30/2021: ALT 31  Recent Lipid Panel    Component Value Date/Time   CHOL 185 11/30/2021 1457   TRIG 149 11/30/2021 1457   HDL 56 11/30/2021 1457   CHOLHDL 3.3 11/30/2021 1457   LDLCALC 103 (H) 11/30/2021 1457    Physical Exam:    VS:  BP (!) 140/88 (BP Location: Right Arm, Patient Position: Sitting)   Pulse 96   Ht 5\' 3"  (1.6 m)   Wt 197 lb (89.4  kg)   LMP  (LMP Unknown)   SpO2 97%   BMI 34.90 kg/m     Wt Readings from Last 3 Encounters:  09/06/22 197 lb (89.4 kg)  02/13/22 194 lb 9.6 oz (88.3 kg)  11/30/21 191 lb (86.6 kg)     GEN:  Well nourished, well developed in no acute distress HEENT: Normal NECK: No JVD; No carotid bruits LYMPHATICS: No lymphadenopathy CARDIAC: RRR, no murmurs, no rubs, no gallops RESPIRATORY:  Clear to auscultation without rales, wheezing or rhonchi  ABDOMEN: Soft, non-tender, non-distended MUSCULOSKELETAL:  No edema; No deformity  SKIN: Warm and dry LOWER EXTREMITIES: no swelling NEUROLOGIC:  Alert and oriented x 3 PSYCHIATRIC:  Normal affect   ASSESSMENT:    1. Coronary artery disease of native artery of native heart with stable angina pectoris (HCC)   2. Occlusion of right carotid artery   3. Essential hypertension   4. Dyslipidemia   5. Atypical chest pain    PLAN:    In order of problems listed above:  Coronary artery disease with known complete occluded right coronary artery with moderate disease elsewhere.  She does have some symptoms of the mid atypical but concerning.  She is on dual antiplatelet therapy which I will continue.  I will put her on long-acting nitroglycerin, will start with Imdur 30.  She does have nitroglycerin as needed that she will take.  I will see her back in a few  weeks and see if situation improves if not we may even consider doing cardiac catheterization. Peripheral vascular disease with completely occluded right carotic artery.  Stable from that point review, no TIA CVA-like symptoms. Essential hypertension: Blood pressure elevated today but addition of Imdur will hopefully improve the situation Epistaxis: I asked her to follow-up with ENT doctor. Weight gain and profound fatigue and tiredness.  I will check complete metabolic panel, CBC, TSH. Dyslipidemia she is taking statin I do have last K PN showing me data from December with LDL of 103 HDL 56.  We will do fasting lipid profile today.   Medication Adjustments/Labs and Tests Ordered: Current medicines are reviewed at length with the patient today.  Concerns regarding medicines are outlined above.  No orders of the defined types were placed in this encounter.  Medication changes: No orders of the defined types were placed in this encounter.   Signed, Georgeanna Lea, MD, [facility] 09/06/2022 9:24 AM    [facility]

## 2022-09-07 LAB — LIPID PANEL
Chol/HDL Ratio: 3.7 ratio (ref 0.0–4.4)
Cholesterol, Total: 231 mg/dL — ABNORMAL HIGH (ref 100–199)
HDL: 62 mg/dL (ref >39)
LDL Chol Calc (NIH): 142 mg/dL — ABNORMAL HIGH (ref 0–99)
Triglycerides: 151 mg/dL — ABNORMAL HIGH (ref 0–149)
VLDL Cholesterol Cal: 27 mg/dL (ref 5–40)

## 2022-09-07 LAB — CBC
Hematocrit: 44.9 % (ref 34.0–46.6)
Hemoglobin: 15 g/dL (ref 11.1–15.9)
MCH: 29.4 pg (ref 26.6–33.0)
MCHC: 33.4 g/dL (ref 31.5–35.7)
MCV: 88 fL (ref 79–97)
Platelets: 349 10*3/uL (ref 150–450)
RBC: 5.1 x10E6/uL (ref 3.77–5.28)
RDW: 13.5 % (ref 11.7–15.4)
WBC: 8.6 10*3/uL (ref 3.4–10.8)

## 2022-09-07 LAB — COMPREHENSIVE METABOLIC PANEL
ALT: 24 IU/L (ref 0–32)
AST: 24 IU/L (ref 0–40)
Albumin/Globulin Ratio: 1.8 (ref 1.2–2.2)
Albumin: 4.9 g/dL (ref 3.8–4.9)
Alkaline Phosphatase: 133 IU/L — ABNORMAL HIGH (ref 44–121)
BUN/Creatinine Ratio: 18 (ref 12–28)
BUN: 14 mg/dL (ref 8–27)
Bilirubin Total: 0.2 mg/dL (ref 0.0–1.2)
CO2: 17 mmol/L — ABNORMAL LOW (ref 20–29)
Calcium: 9.7 mg/dL (ref 8.7–10.3)
Chloride: 97 mmol/L (ref 96–106)
Creatinine, Ser: 0.76 mg/dL (ref 0.57–1.00)
Globulin, Total: 2.7 g/dL (ref 1.5–4.5)
Glucose: 98 mg/dL (ref 70–99)
Potassium: 4.8 mmol/L (ref 3.5–5.2)
Sodium: 137 mmol/L (ref 134–144)
Total Protein: 7.6 g/dL (ref 6.0–8.5)
eGFR: 90 mL/min/{1.73_m2} (ref >59)

## 2022-09-07 LAB — TSH: TSH: 1.92 u[IU]/mL (ref 0.450–4.500)

## 2022-09-20 ENCOUNTER — Telehealth: Payer: Self-pay | Admitting: Cardiology

## 2022-09-20 DIAGNOSIS — I10 Essential (primary) hypertension: Secondary | ICD-10-CM

## 2022-09-20 DIAGNOSIS — I25118 Atherosclerotic heart disease of native coronary artery with other forms of angina pectoris: Secondary | ICD-10-CM

## 2022-09-20 NOTE — Telephone Encounter (Signed)
Results reviewed with pt as per Dr. Krasowski's note.  Pt verbalized understanding and had no additional questions. Routed to PCP  

## 2022-09-20 NOTE — Telephone Encounter (Signed)
Patient returned call for lab results.  

## 2022-09-21 ENCOUNTER — Other Ambulatory Visit: Payer: Self-pay | Admitting: Cardiology

## 2022-10-04 ENCOUNTER — Telehealth: Payer: Self-pay | Admitting: Cardiology

## 2022-10-04 ENCOUNTER — Ambulatory Visit: Payer: Managed Care, Other (non HMO) | Admitting: Cardiology

## 2022-10-04 NOTE — Telephone Encounter (Signed)
Pt reported she was having back pain and could not come to her appt today. She stated that she is still having intermittent chest pain and the Nitro just makes her lightheaded. She reported some numbness in her left arm and hand. She is wanting to know what she should do next as far as testing to see what is causing chest pain.

## 2022-10-04 NOTE — Telephone Encounter (Signed)
New Message:     Patient had an appointment for today, but hurt her back. She is not coming. She said she would like for the nurse to please call her. She says she needs to talk to her please.

## 2022-10-05 ENCOUNTER — Telehealth: Payer: Self-pay | Admitting: Cardiology

## 2022-10-05 MED ORDER — ISOSORBIDE MONONITRATE ER 60 MG PO TB24: 60.0000 mg | ORAL_TABLET | Freq: Every day | ORAL | 3 refills | Status: DC

## 2022-10-05 NOTE — Telephone Encounter (Signed)
Spoke with pt regarding her message and Dr. Wendy Poet recommendations. Will send Imdur to pharmacy per Dr. Wendy Poet note. Pt agreed and verbalized understanding.

## 2022-10-05 NOTE — Telephone Encounter (Signed)
Pt would like to know about taking Ozempic for weight management. Advised to talk with PCP but states that Dr. Agustin Cree thought it would be good. Please advise on RX or sending to weight management.

## 2022-10-05 NOTE — Telephone Encounter (Signed)
Patient called to talk with Dr. Agustin Cree or nurse. Patient says she has an important question to ask

## 2022-10-09 NOTE — Telephone Encounter (Signed)
Left VM for pt to call back.

## 2022-10-10 NOTE — Telephone Encounter (Signed)
Recommendations reviewed with pt as per Dr. Munley's note.  Pt verbalized understanding and had no additional questions.  

## 2022-10-17 ENCOUNTER — Emergency Department (HOSPITAL_COMMUNITY)
Admission: EM | Admit: 2022-10-17 | Discharge: 2022-10-18 | Discharge status: Against Medical Advice | Payer: Managed Care, Other (non HMO) | Attending: Emergency Medicine | Admitting: Emergency Medicine

## 2022-10-17 ENCOUNTER — Other Ambulatory Visit: Payer: Self-pay

## 2022-10-17 ENCOUNTER — Emergency Department (HOSPITAL_COMMUNITY): Payer: Managed Care, Other (non HMO)

## 2022-10-17 ENCOUNTER — Encounter (HOSPITAL_COMMUNITY): Payer: Self-pay

## 2022-10-17 DIAGNOSIS — I10 Essential (primary) hypertension: Secondary | ICD-10-CM | POA: Diagnosis not present

## 2022-10-17 DIAGNOSIS — R079 Chest pain, unspecified: Secondary | ICD-10-CM | POA: Insufficient documentation

## 2022-10-17 DIAGNOSIS — Z5321 Procedure and treatment not carried out due to patient leaving prior to being seen by health care provider: Secondary | ICD-10-CM | POA: Diagnosis not present

## 2022-10-17 DIAGNOSIS — R030 Elevated blood-pressure reading, without diagnosis of hypertension: Secondary | ICD-10-CM | POA: Insufficient documentation

## 2022-10-17 DIAGNOSIS — F17200 Nicotine dependence, unspecified, uncomplicated: Secondary | ICD-10-CM | POA: Diagnosis not present

## 2022-10-17 LAB — BASIC METABOLIC PANEL
Anion gap: 12 (ref 5–15)
BUN: 14 mg/dL (ref 8–23)
CO2: 20 mmol/L — ABNORMAL LOW (ref 22–32)
Calcium: 9.2 mg/dL (ref 8.9–10.3)
Chloride: 105 mmol/L (ref 98–111)
Creatinine, Ser: 0.65 mg/dL (ref 0.44–1.00)
GFR, Estimated: >60 mL/min (ref >60)
Glucose, Bld: 109 mg/dL — ABNORMAL HIGH (ref 70–99)
Potassium: 3.9 mmol/L (ref 3.5–5.1)
Sodium: 137 mmol/L (ref 135–145)

## 2022-10-17 LAB — CBC
HCT: 43.7 % (ref 36.0–46.0)
Hemoglobin: 14.6 g/dL (ref 12.0–15.0)
MCH: 29.5 pg (ref 26.0–34.0)
MCHC: 33.4 g/dL (ref 30.0–36.0)
MCV: 88.3 fL (ref 80.0–100.0)
Platelets: 329 10*3/uL (ref 150–400)
RBC: 4.95 MIL/uL (ref 3.87–5.11)
RDW: 14.9 % (ref 11.5–15.5)
WBC: 10.4 10*3/uL (ref 4.0–10.5)
nRBC: 0 % (ref 0.0–0.2)

## 2022-10-17 LAB — TROPONIN I (HIGH SENSITIVITY): Troponin I (High Sensitivity): 6 ng/L (ref <18)

## 2022-10-17 NOTE — ED Triage Notes (Signed)
Left sided chest pain radiating to left upper back and left arm.   Pressures were 236/141 at home with EMS. Took 1 SL Nitro enroute amd dropped to 90/60. Pt became pale, diaphoretic, and clammy. 324mg  ASA taken pta.

## 2022-10-17 NOTE — ED Provider Triage Note (Signed)
Emergency Medicine Provider Triage Evaluation Note  Samantha Russo , a 61 y.o. female  was evaluated in triage.  Pt complains of chest pain left-sided going on for last month provide she is gotten worse states its been more consistent has pleuritic chest pain but no actual shortness of breath states that she will feel pain rating down her left arm and left back she notes that her blood pressures been elevated in the 200s no endorsing any paresthesias or weakness the upper or lower extremities no headaches no change in vision states she has been taking her nitro without much relief.  She has history of TIAs, right carotid occlusion with known cardiovascular disease, she has hypertension hyperlipidemia she is a current smoker she is a nondiabetic.  She is followed by cardiology..  Review of Systems  Positive: Chest pain, elevated blood pressure Negative: Of breath, diaphoresis  Physical Exam  BP 128/84   Pulse 82   Temp 98.4 F (36.9 C) (Oral)   Resp 16   Ht 5\' 3"  (1.6 m)   Wt 90.7 kg   LMP  (LMP Unknown)   SpO2 98%   BMI 35.43 kg/m  Gen:   Awake, no distress   Resp:  Normal effort  MSK:   Moves extremities without difficulty  Other:    Medical Decision Making  Medically screening exam initiated at 10:22 PM.  Appropriate orders placed.  Janese Radabaugh was informed that the remainder of the evaluation will be completed by another provider, this initial triage assessment does not replace that evaluation, and the importance of remaining in the ED until their evaluation is complete.  Lab works been ordered will need further work-up.   Marcello Fennel, PA-C 10/17/22 [id number]

## 2022-10-17 NOTE — ED Notes (Signed)
EKG done, not crossing over. Hard copy in triage

## 2022-10-18 NOTE — ED Notes (Signed)
Patient left on own accord °

## 2022-11-01 ENCOUNTER — Other Ambulatory Visit: Payer: Self-pay | Admitting: Vascular Surgery

## 2022-11-01 NOTE — Telephone Encounter (Signed)
Not prescribed by a provider at this office

## 2022-11-05 ENCOUNTER — Ambulatory Visit: Payer: Managed Care, Other (non HMO) | Admitting: Cardiology

## 2022-11-08 ENCOUNTER — Telehealth: Payer: Self-pay | Admitting: Cardiology

## 2022-11-08 NOTE — Telephone Encounter (Signed)
[facility] is requesting a letter of clearance before they even see patient in the office for the first time. Tele # [phone].

## 2022-11-09 ENCOUNTER — Other Ambulatory Visit: Payer: Self-pay | Admitting: Cardiology

## 2022-11-09 NOTE — Telephone Encounter (Signed)
This is a request for a general referral not a preop request. Forwarding to primary cardiologist.

## 2022-11-13 NOTE — Telephone Encounter (Signed)
Advised that no clearance is needed to see the dentist. Advised that once the dentist evaluates and procedure needed is known the office will send in a clearance. Pt verbalized understanding  and had no additional questions.

## 2022-12-06 ENCOUNTER — Other Ambulatory Visit: Payer: Self-pay | Admitting: Cardiology

## 2023-01-15 ENCOUNTER — Ambulatory Visit: Payer: Managed Care, Other (non HMO) | Admitting: Cardiology

## 2023-03-04 ENCOUNTER — Telehealth: Payer: Self-pay

## 2023-03-04 NOTE — Telephone Encounter (Signed)
   Pre-operative Risk Assessment    Patient Name: Samantha Russo  DOB: Feb 17, 1961 MRN: [id number]     Request for Surgical Clearance    Procedure:   TWO DENTAL IMPLANTS  Date of Surgery:  Clearance TBD                                 Surgeon:  DR. REHM Surgeon's Group or Practice Name:  [facility] [place] Phone number:  [phone] Fax number:  [phone]   Type of Clearance Requested:   - Pharmacy:  Hold Aspirin and Clopidogrel (Plavix) INSTRUCTIONS ON WHEN TO HOLD   Type of Anesthesia:   IV SEDATION   Additional requests/questions:    SignedJacinta Shoe   03/04/2023, 5:32 PM

## 2023-03-05 DIAGNOSIS — I6529 Occlusion and stenosis of unspecified carotid artery: Secondary | ICD-10-CM | POA: Insufficient documentation

## 2023-03-05 DIAGNOSIS — G459 Transient cerebral ischemic attack, unspecified: Secondary | ICD-10-CM | POA: Insufficient documentation

## 2023-03-05 DIAGNOSIS — I219 Acute myocardial infarction, unspecified: Secondary | ICD-10-CM | POA: Insufficient documentation

## 2023-03-05 NOTE — Telephone Encounter (Signed)
   Name: Samantha Russo  DOB: 09/08/1961  MRN: [id number]  Primary Cardiologist: None   Preoperative team, please contact this patient and set up a phone call appointment for further preoperative risk assessment. Please obtain consent and complete medication review. Thank you for your help.  I confirm that guidance regarding antiplatelet and oral anticoagulation therapy has been completed and, if necessary, noted below.  Her Plavix may be held for 5 days prior to her procedure.  Her aspirin should be continued throughout her procedure.  Please resume Plavix as soon as hemostasis is achieved.   Deberah Pelton, NP 03/05/2023, 7:11 AM [place]

## 2023-03-05 NOTE — Telephone Encounter (Signed)
Pt has appt 03/06/23 with Dr. Agustin Cree. I will add need pre op clearance to appt notes. Will update all parties involved.

## 2023-03-06 ENCOUNTER — Ambulatory Visit: Payer: Managed Care, Other (non HMO) | Attending: Cardiology | Admitting: Cardiology

## 2023-03-06 ENCOUNTER — Encounter: Payer: Self-pay | Admitting: Cardiology

## 2023-03-06 VITALS — BP 146/92 | HR 89 | Ht 63.0 in | Wt 200.0 lb

## 2023-03-06 DIAGNOSIS — R0789 Other chest pain: Secondary | ICD-10-CM

## 2023-03-06 DIAGNOSIS — I10 Essential (primary) hypertension: Secondary | ICD-10-CM

## 2023-03-06 DIAGNOSIS — G459 Transient cerebral ischemic attack, unspecified: Secondary | ICD-10-CM | POA: Diagnosis not present

## 2023-03-06 DIAGNOSIS — I25118 Atherosclerotic heart disease of native coronary artery with other forms of angina pectoris: Secondary | ICD-10-CM | POA: Diagnosis not present

## 2023-03-06 DIAGNOSIS — E785 Hyperlipidemia, unspecified: Secondary | ICD-10-CM

## 2023-03-06 NOTE — Patient Instructions (Signed)
Medication Instructions:  Your physician recommends that you continue on your current medications as directed. Please refer to the Current Medication list given to you today.  *If you need a refill on your cardiac medications before your next appointment, please call your pharmacy*   Lab Work: None Ordered If you have labs (blood work) drawn today and your tests are completely normal, you will receive your results only by: MyChart Message (if you have MyChart) OR A paper copy in the mail If you have any lab test that is abnormal or we need to change your treatment, we will call you to review the results.   Testing/Procedures: Your physician has requested that you have a carotid duplex. This test is an ultrasound of the carotid arteries in your neck. It looks at blood flow through these arteries that supply the brain with blood. Allow one hour for this exam. There are no restrictions or special instructions.    Follow-Up: At [facility], you and your health needs are our priority.  As part of our continuing mission to provide you with exceptional heart care, we have created designated Provider Care Teams.  These Care Teams include your primary Cardiologist (physician) and Advanced Practice Providers (APPs -  Physician Assistants and Nurse Practitioners) who all work together to provide you with the care you need, when you need it.  We recommend signing up for the patient portal called "MyChart".  Sign up information is provided on this After Visit Summary.  MyChart is used to connect with patients for Virtual Visits (Telemedicine).  Patients are able to view lab/test results, encounter notes, upcoming appointments, etc.  Non-urgent messages can be sent to your provider as well.   To learn more about what you can do with MyChart, go to [email or web address].    Your next appointment:   6 month(s)  The format for your next appointment:   In Person  Provider:   Robert Krasowski, MD     Other Instructions NA  

## 2023-03-06 NOTE — Progress Notes (Signed)
Cardiology Office Note:    Date:  03/06/2023   ID:  Samantha Russo, DOB 08-Jun-1961, MRN [id number]  PCP:  Haydee Salter, MD  Cardiologist:  Jenne Campus, MD    Referring MD: Haydee Salter, MD   No chief complaint on file. I need surgery to remove my dental implants  History of Present Illness:    Samantha Russo is a 62 y.o. female  with complex past medical history.  Apparently she relocated to our area from [place] she was referred to me because of episode of chest pain.  She does have multiple risk factors for coronary artery disease including hypertension, dyslipidemia very high LDL that initially was not treated smoking.  She did have echocardiogram done which showed normal left ventricle ejection fraction left-ventricular hypertrophy.  There were no segmental motion abnormalities, after that she did not coronary CT angio which showed completely occluded right coronary artery with nonobstructive mild to moderate disease elsewhere.  Decision has been made to treat her medically.  She was put on antiplatelet therapy as well as beta-blocker and statin.  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  However cervical and brain CT angiogram showed completely occluded right internal carotic artery at the base, therefore, surgery has not been indicated.  She also got normal antegrade flow within vertebral arteries.  Comes today to months for follow-up.  Overall she is feeling miserable she does have some dental work done still a lot of pain apparently does have 2 dental implant  done and does need to come out and she is here to make sure it is safe from cardiac point of view to do that she is worried about a lot of things she is worried about carotic artery stenosis she warned about the stenosis that may be getting worse.  Denies have any chest pain tightness squeezing pressure burning chest.  Past Medical History:  Diagnosis Date   Atypical chest pain 07/25/2021   Carotid artery occlusion    Cervical disc disorder with radiculopathy 08/09/2015   Chronic neck and back pain    Heart attack ([place])    Hypertension    Smoking 07/25/2021   TIA (transient ischemic attack)     Past Surgical History:  Procedure Laterality Date   PLACEMENT OF BREAST IMPLANTS     SPINAL FUSION     2012 2013    Current Medications: Current Meds  Medication Sig   aspirin EC 81 MG tablet Take 1 tablet (81 mg total) by mouth daily. Swallow whole.   clopidogrel (PLAVIX) 75 MG tablet Take 1 tablet (75 mg total) by mouth daily.   diazepam (VALIUM) 5 MG tablet Take 1 tablet (5 mg total) by mouth daily as needed for anxiety.   ezetimibe (ZETIA) 10 MG tablet TAKE 1 TABLET(10 MG) BY MOUTH DAILY (Patient taking differently: Take 10 mg by mouth daily.)   isosorbide mononitrate (IMDUR) 60 MG 24 hr tablet Take 1 tablet (60 mg total) by mouth daily.   nebivolol (BYSTOLIC) 5 MG tablet TAKE 1 TABLET(5 MG) BY MOUTH DAILY   nitroGLYCERIN (  NITROLINGUAL) 0.4 MG/SPRAY spray Place 1 spray under the tongue every 5 (five) minutes x 3 doses as needed for chest pain.   rosuvastatin (CRESTOR) 40 MG tablet TAKE 1 TABLET(40 MG) BY MOUTH DAILY   spironolactone (ALDACTONE) 25 MG tablet TAKE 1 TABLET(25 MG) BY MOUTH DAILY   vitamin C (ASCORBIC ACID) 500 MG tablet Take 500 mg by mouth daily.   Vitamin D, Ergocalciferol, (DRISDOL) 1.25 MG (50000 UNIT) CAPS capsule Take 50,000 Units by mouth every 3 (three) days.     Allergies:   Patient has no known allergies.   Social History   Socioeconomic History   Marital  status: Married    Spouse name: Not on file   Number of children: 0   Years of education: Not on file   Highest education level: Not on file  Occupational History   Occupation: Retired  Tobacco Use   Smoking status: Every Day    Packs/day: 0.10    Types: Cigarettes   Smokeless tobacco: Never   Tobacco comments:    Less than 4 cigs a week  Vaping Use   Vaping Use: Never used  Substance and Sexual Activity   Alcohol use: Yes    Alcohol/week: 4.0 standard drinks of alcohol    Types: 4 Glasses of wine per week   Drug use: Never   Sexual activity: Yes  Other Topics Concern   Not on file  Social History Narrative   2 step-children in the [place], [place]   Social Determinants of Health   Financial Resource Strain: Not on file  Food Insecurity: Not on file  Transportation Needs: Not on file  Physical Activity: Not on file  Stress: Not on file  Social Connections: Not on file     Family History: The patient's family history includes Diabetes in her father and paternal grandmother; Heart disease in her father, maternal aunt, maternal grandfather, maternal grandmother, and paternal grandfather; Heart failure in her father and maternal grandmother; Hyperlipidemia in her father and mother; Hypertension in her mother; Stroke in her maternal grandmother and paternal grandmother. ROS:   Please see the history of present illness.    All 14 point review of systems negative except as described per history of present illness  EKGs/Labs/Other Studies Reviewed:      Recent Labs: 09/06/2022: ALT 24; TSH 1.920 10/17/2022: BUN 14; Creatinine, Ser 0.65; Hemoglobin 14.6; Platelets 329; Potassium 3.9; Sodium 137  Recent Lipid Panel    Component Value Date/Time   CHOL 231 (H) 09/06/2022 0944   TRIG 151 (H) 09/06/2022 0944   HDL 62 09/06/2022 0944   CHOLHDL 3.7 09/06/2022 0944   LDLCALC 142 (H) 09/06/2022 0944    Physical Exam:    VS:  BP (!) 146/92 (BP Location: Right Arm, Patient  Position: Sitting)   Pulse 89   Ht '5\' 3"'$  (1.6 m)   Wt 200 lb (90.7 kg)   LMP  (LMP Unknown)   SpO2 98%   BMI 35.43 kg/m     Wt Readings from Last 3 Encounters:  03/06/23 200 lb (90.7 kg)  10/17/22 200 lb (90.7 kg)  09/06/22 197 lb (89.4 kg)     GEN:  Well nourished, well developed in no acute distress HEENT: Normal NECK: No JVD; No carotid bruits LYMPHATICS: No lymphadenopathy CARDIAC: RRR, no murmurs, no rubs, no gallops RESPIRATORY:  Clear to auscultation without rales, wheezing or rhonchi  ABDOMEN: Soft, non-tender, non-distended MUSCULOSKELETAL:  No edema; No deformity  SKIN: Warm and dry LOWER  EXTREMITIES: no swelling NEUROLOGIC:  Alert and oriented x 3 PSYCHIATRIC:  Normal affect   ASSESSMENT:    1. Coronary artery disease of native artery of native heart with stable angina pectoris ([place])   2. Atypical chest pain   3. Essential hypertension   4. TIA (transient ischemic attack)   5. Dyslipidemia    PLAN:    In order of problems listed above:  Coronary disease stable from that point review and appropriate medication she is on dual antiplatelet therapy which I will continue for now however if does need to be with hold for few days about around surgical time that should be fine. Atypical chest pain denies having any. Essential hypertension blood pressure elevated today but she said that she is in a lot of pain. Dyslipidemia I did review K PN which show me her LDL 142 HDL 62 this is from September of last year apparently she is taking high intense statin and Zetia however when asked her to do fasting lipid profile she does not want to do that right now because of the fact that she has been eating poorly lately because of inability to eat good food. History of TIA denies having any. Will schedule her to have carotic ultrasound   Medication Adjustments/Labs and Tests Ordered: Current medicines are reviewed at length with the patient today.  Concerns regarding medicines  are outlined above.  No orders of the defined types were placed in this encounter.  Medication changes: No orders of the defined types were placed in this encounter.   Signed, Park Liter, MD, [facility] 03/06/2023 5:04 PM    [place]

## 2023-03-13 IMAGING — CT CT HEART MORP W/ CTA COR W/ SCORE W/ CA W/CM &/OR W/O CM
1 series · 12 of 20 positions shown, 15 images · non-contrast
Comparison: 07/22/2021 chest radiograph.
COMPARISON: 07/22/2021 chest radiograph.

Addendum:
EXAM:
OVER-READ INTERPRETATION  CT CHEST

The following report is an over-read performed by radiologist Dr.
Goriline Merkl [REDACTED] on 08/03/2021. This over-read
does not include interpretation of cardiac or coronary anatomy or
pathology. The coronary CTA interpretation by the cardiologist is
attached.
CLINICAL DATA: Chest pain
Cardiac/Coronary CTA
TECHNIQUE: A non-contrast, gated CT scan was obtained with axial slices of 3 mm
through the heart for calcium scoring. Calcium scoring was performed
using the Agatston method. A 100 kV prospective, gated, contrast
cardiac scan was obtained. Gantry rotation speed was 250 msecs and
collimation was 0.6 mm. Two sublingual nitroglycerin tablets (0.8
mg) were given. The 3D data set was reconstructed in 5% intervals of
the 35-75% of the R-R cycle. Diastolic phases were analyzed on a
dedicated workstation using MPR, MIP, and VRT modes. The patient
received 95 cc of contrast.

[Series 1221: findings · 12 of 26 slices shown, 15 images]
[im 2/26  vessel]
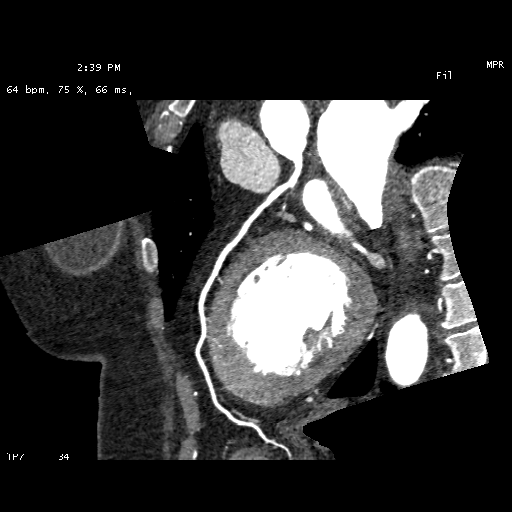
[im 2/26  lung]
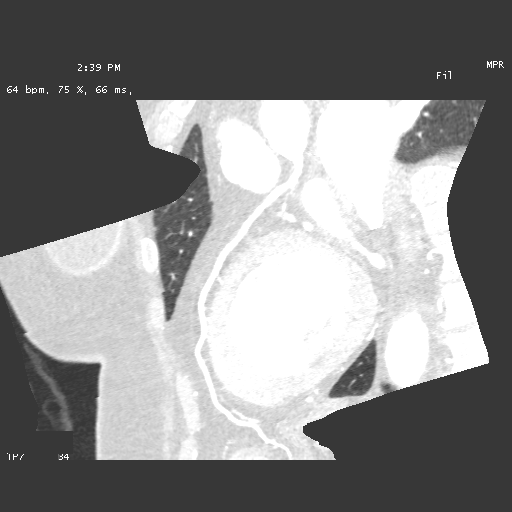
[im 4/26  vessel]
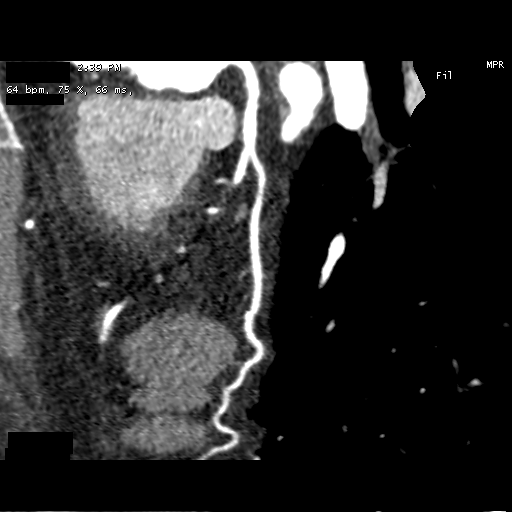
[im 6/26  vessel]
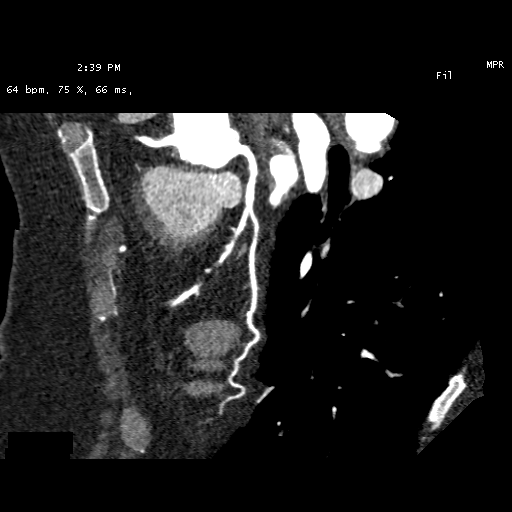
[im 8/26  vessel]
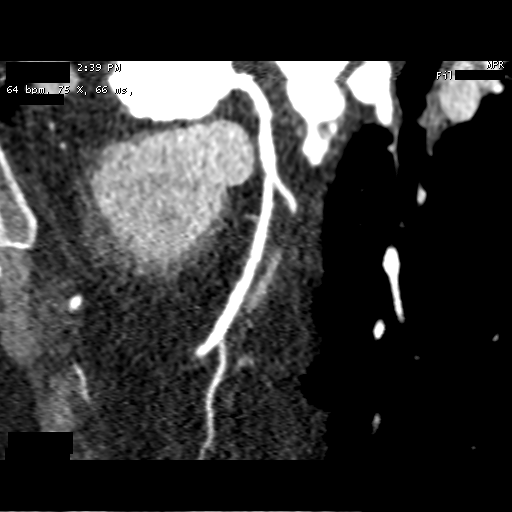
[im 10/26  vessel]
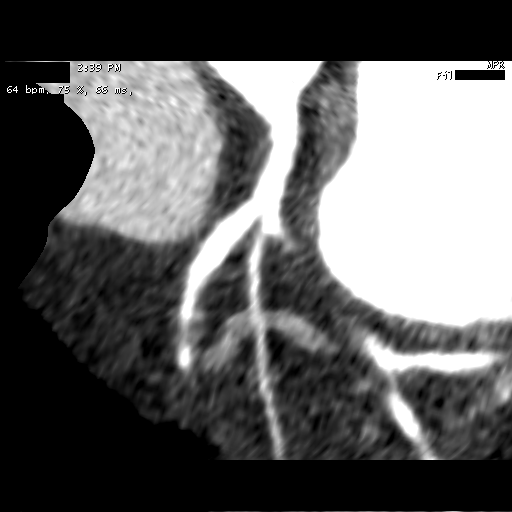
[im 10/26  lung]
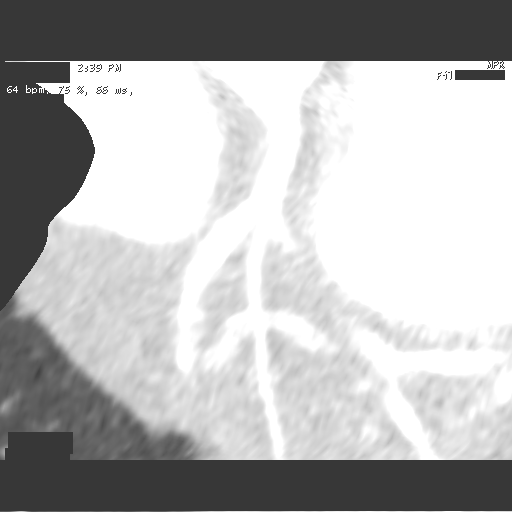
[im 12/26  vessel]
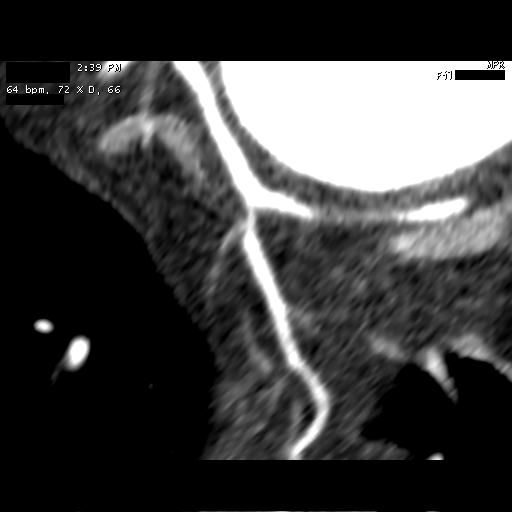
[im 14/26  vessel]
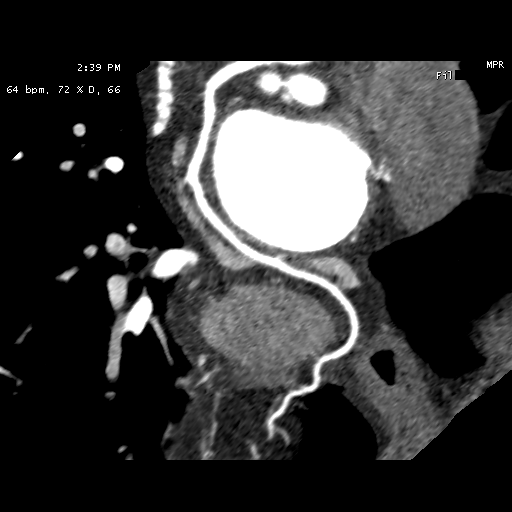
[im 16/26  vessel]
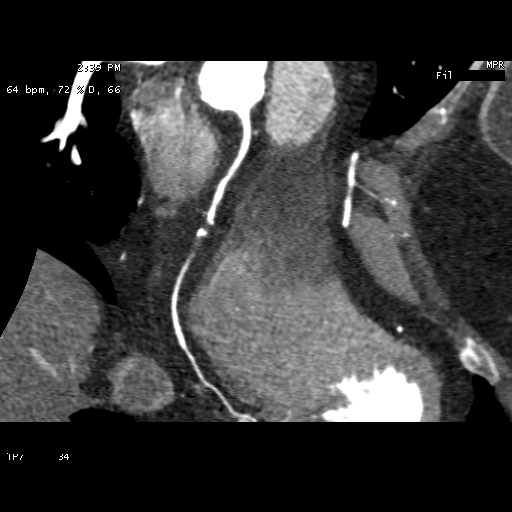
[im 18/26  vessel]
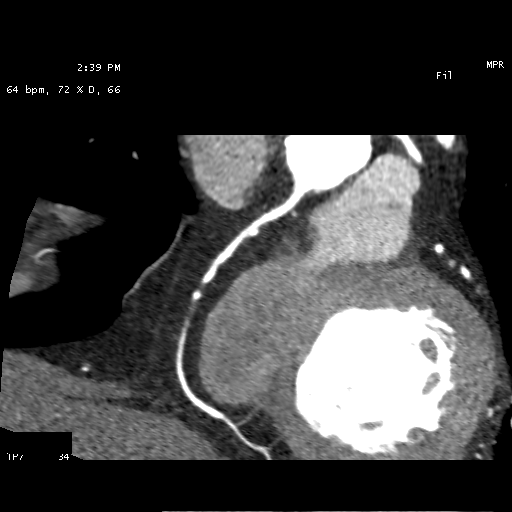
[im 18/26  lung]
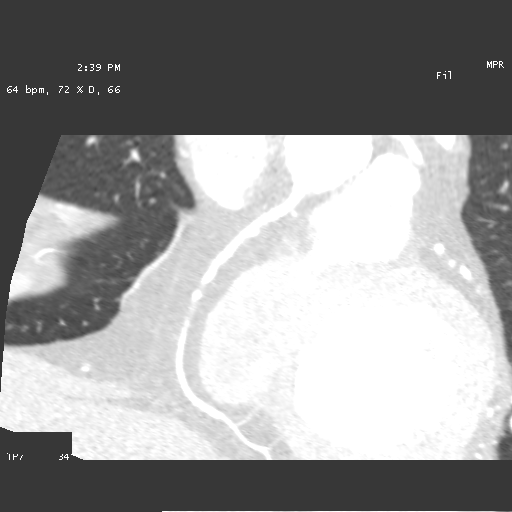
[im 20/26  vessel]
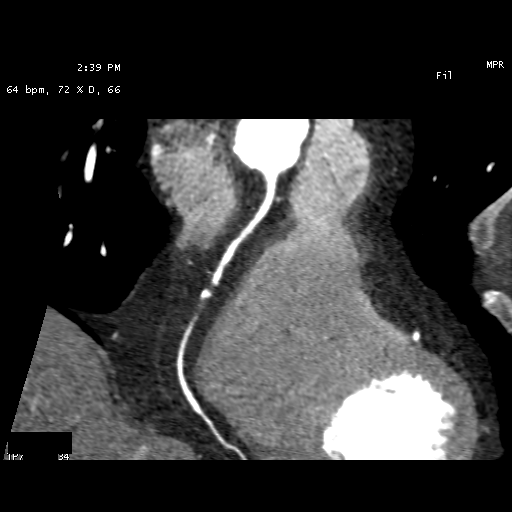
[im 22/26  vessel]
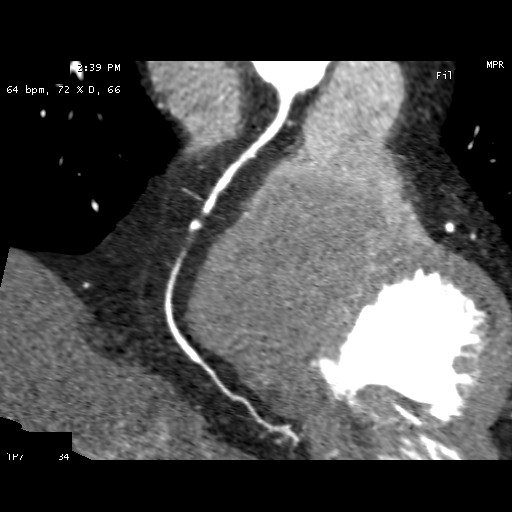
[im 24/26  vessel]
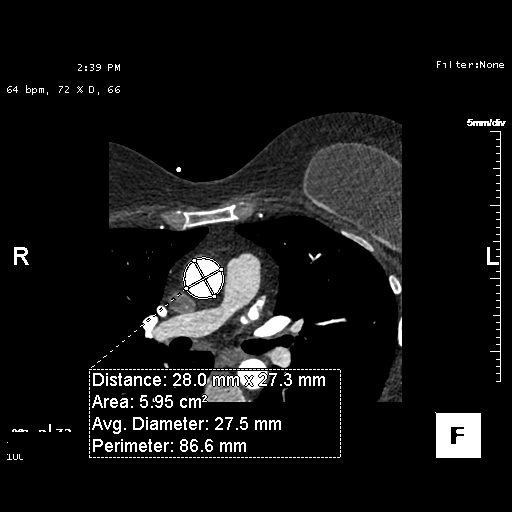

[12 of 20 positions shown; findings below may reference images not displayed]

FINDINGS: Vascular: Aortic atherosclerosis. No central pulmonary embolism, on
this non-dedicated study.

Mediastinum/Nodes: No imaged thoracic adenopathy.

Lungs/Pleura: No pleural fluid.  Clear imaged lungs.

Upper Abdomen: Normal imaged portions of the liver.

Musculoskeletal: Intracapsular rupture of a right breast implant.
Left breast implant is incompletely imaged. No acute osseous
abnormality.
IMPRESSION: 1. No acute findings in the imaged extracardiac chest.
2. Aortic Atherosclerosis (DVABS-EVO.O).
FINDINGS: Image quality: Excellent.

Noise artifact is: Limited.

Coronary Arteries:  Normal coronary origin.  Right dominance.

Left main: The left main is a large caliber vessel with a normal
take off from the left coronary cusp that bifurcates to form a left
anterior descending artery and a left circumflex artery. There is no
plaque or stenosis.

Left anterior descending artery: There is minimal non-calcified
plaque (<25%) in the mid LAD. There is minimal non-calcified plaque
in the first diagonal (<25%). The second diagonal is patent.

Left circumflex artery: The LCX is non-dominant and patent with no
evidence of plaque or stenosis. OM1 contains a moderate
non-calcified plaque (50-69%). The second OM branch is patent.

Right coronary artery: The RCA is dominant with normal take off from
the right coronary cusp. The proximal RCA contains minimal calcified
plaque (<25%). The mid RCA appears occluded (100%), with flow distal
suspicious for chronic total occlusion as the PDA fills with
contrast.

Right Atrium: Right atrial size is within normal limits.

Right Ventricle: The right ventricular cavity is within normal
limits.

Left Atrium: Left atrial size is normal in size with no left atrial
appendage filling defect.

Left Ventricle: The ventricular cavity size is within normal limits.
There are no stigmata of prior infarction. There is no abnormal
filling defect.

Pulmonary arteries: Normal in size without proximal filling defect.

Pulmonary veins: Normal pulmonary venous drainage.

Pericardium: Normal thickness with no significant effusion or
calcium present.

Cardiac valves: The aortic valve is trileaflet without significant
calcification. The mitral valve is normal structure without
significant calcification.

Aorta: Normal caliber with no significant disease.

Extra-cardiac findings: See attached radiology report for
non-cardiac structures.
IMPRESSION: 1. Coronary calcium score of 62.8. This was 87th percentile for
age-, sex, and race-matched controls.

2. Normal coronary origin with right dominance.

3. Minimal CAD (<25%) in the LAD.

4. OM1 contains a moderate non-calcified plaque (50-69%).

5. The mid RCA appears occluded (100%) with contrast in the distal
RCA suspicious for chronic total occlusion with collateral flow.

RECOMMENDATIONS:
1. Moderate stenosis in OM1 and concerns for total coronary
occlusion of the mid RCA. Cardiac catheterization is recommended. CT
FFR will be submitted for the OM1. Consider symptom-guided
anti-ischemic pharmacotherapy as well as risk factor modification
per guideline directed care.

*** End of Addendum ***
EXAM:
OVER-READ INTERPRETATION  CT CHEST

The following report is an over-read performed by radiologist Dr.
Goriline Merkl [REDACTED] on 08/03/2021. This over-read
does not include interpretation of cardiac or coronary anatomy or
pathology. The coronary CTA interpretation by the cardiologist is
attached.
FINDINGS: Vascular: Aortic atherosclerosis. No central pulmonary embolism, on
this non-dedicated study.

Mediastinum/Nodes: No imaged thoracic adenopathy.

Lungs/Pleura: No pleural fluid.  Clear imaged lungs.

Upper Abdomen: Normal imaged portions of the liver.

Musculoskeletal: Intracapsular rupture of a right breast implant.
Left breast implant is incompletely imaged. No acute osseous
abnormality.
IMPRESSION: 1. No acute findings in the imaged extracardiac chest.
2. Aortic Atherosclerosis (DVABS-EVO.O).

## 2023-03-14 ENCOUNTER — Ambulatory Visit (HOSPITAL_COMMUNITY)
Admission: RE | Admit: 2023-03-14 | Discharge: 2023-03-14 | Disposition: A | Discharge status: Home / Self Care | Payer: Managed Care, Other (non HMO) | Source: Ambulatory Visit | Attending: Cardiology | Admitting: Cardiology

## 2023-03-14 DIAGNOSIS — G459 Transient cerebral ischemic attack, unspecified: Secondary | ICD-10-CM | POA: Diagnosis present

## 2023-03-14 DIAGNOSIS — I25118 Atherosclerotic heart disease of native coronary artery with other forms of angina pectoris: Secondary | ICD-10-CM | POA: Insufficient documentation

## 2023-03-21 ENCOUNTER — Encounter: Payer: Self-pay | Admitting: Vascular Surgery

## 2023-03-21 ENCOUNTER — Ambulatory Visit (INDEPENDENT_AMBULATORY_CARE_PROVIDER_SITE_OTHER): Payer: Managed Care, Other (non HMO) | Admitting: Vascular Surgery

## 2023-03-21 VITALS — BP 151/102 | HR 107 | Temp 98.4°F | Resp 20 | Ht 63.0 in | Wt 200.0 lb

## 2023-03-21 DIAGNOSIS — I6523 Occlusion and stenosis of bilateral carotid arteries: Secondary | ICD-10-CM

## 2023-03-21 NOTE — Progress Notes (Addendum)
REASON FOR VISIT:   Follow-up of carotid disease  MEDICAL ISSUES:   BILATERAL CAROTID DISEASE: This patient has a known right internal carotid artery occlusion.  Her moderate left carotid stenosis is unchanged.  She is in the 40 to 59% range.  Her carotid duplex scans are being done by her cardiologist at 50-month intervals according to the patient.  I will be happy to see her back at any time if she develops new neurologic symptoms or the stenosis on the left progresses.  She is on aspirin, Plavix, and a statin.  I explained we would not consider carotid endarterectomy unless the stenosis progressed to greater than 80% or she develop new left hemispheric symptoms.  Of note, with respect to her carotid disease, I do not see any contraindication to her upcoming oral surgery by Dr. Romie Minus.   HPI:   Rhesa Pirraglia is a pleasant 62 y.o. female who I last saw on 10/05/2021.  She has a known right internal carotid artery occlusion.  She had a 40 to 59% left carotid stenosis by duplex but previous CT scan showed minimal stenosis on the left.  She previously had an episode of transient loss of vision in the right eye.  When I saw her last she was on aspirin Plavix and a statin.  I set up a follow-up visit at this time.  Since I saw her last, she is having some significant dental issues and is requiring surgery.  She denies any history of stroke, TIAs, expressive or receptive aphasia, or recent episodes of amaurosis fugax.  She is on aspirin, Plavix, and a statin.  Past Medical History:  Diagnosis Date   Atypical chest pain 07/25/2021   Carotid artery occlusion    Cervical disc disorder with radiculopathy 08/09/2015   Chronic neck and back pain    Heart attack ([place])    Hypertension    Smoking 07/25/2021   TIA (transient ischemic attack)     Family History  Problem Relation Age of Onset   Hyperlipidemia Mother    Hypertension Mother    Hyperlipidemia Father    Heart disease Father     Heart failure Father    Diabetes Father    Heart disease Maternal Aunt    Heart disease Maternal Grandmother    Stroke Maternal Grandmother    Heart failure Maternal Grandmother    Heart disease Maternal Grandfather    Diabetes Paternal Grandmother    Stroke Paternal Grandmother    Heart disease Paternal Grandfather     SOCIAL HISTORY: Social History   Tobacco Use   Smoking status: Every Day    Packs/day: .1    Types: Cigarettes   Smokeless tobacco: Never   Tobacco comments:    Less than 4 cigs a week  Substance Use Topics   Alcohol use: Yes    Alcohol/week: 4.0 standard drinks of alcohol    Types: 4 Glasses of wine per week    No Known Allergies  Current Outpatient Medications  Medication Sig Dispense Refill   aspirin EC 81 MG tablet Take 1 tablet (81 mg total) by mouth daily. Swallow whole. 90 tablet 3   clopidogrel (PLAVIX) 75 MG tablet Take 1 tablet (75 mg total) by mouth daily. 90 tablet 2   diazepam (VALIUM) 5 MG tablet Take 1 tablet (5 mg total) by mouth daily as needed for anxiety. 20 tablet 0   ezetimibe (ZETIA) 10 MG tablet TAKE 1 TABLET(10 MG) BY MOUTH DAILY (Patient taking differently:  Take 10 mg by mouth daily.) 90 tablet 1   isosorbide mononitrate (IMDUR) 60 MG 24 hr tablet Take 1 tablet (60 mg total) by mouth daily. 90 tablet 3   nebivolol (BYSTOLIC) 5 MG tablet TAKE 1 TABLET(5 MG) BY MOUTH DAILY 90 tablet 2   nitroGLYCERIN (NITROLINGUAL) 0.4 MG/SPRAY spray Place 1 spray under the tongue every 5 (five) minutes x 3 doses as needed for chest pain. 30 g 11   rosuvastatin (CRESTOR) 40 MG tablet TAKE 1 TABLET(40 MG) BY MOUTH DAILY 90 tablet 2   spironolactone (ALDACTONE) 25 MG tablet TAKE 1 TABLET(25 MG) BY MOUTH DAILY 90 tablet 2   vitamin C (ASCORBIC ACID) 500 MG tablet Take 500 mg by mouth daily.     Vitamin D, Ergocalciferol, (DRISDOL) 1.25 MG (50000 UNIT) CAPS capsule Take 50,000 Units by mouth every 3 (three) days.     No current facility-administered  medications for this visit.    REVIEW OF SYSTEMS:  [X]  denotes positive finding, [ ]  denotes negative finding Cardiac  Comments:  Chest pain or chest pressure:    Shortness of breath upon exertion:    Short of breath when lying flat:    Irregular heart rhythm:        Vascular    Pain in calf, thigh, or hip brought on by ambulation:    Pain in feet at night that wakes you up from your sleep:     Blood clot in your veins:    Leg swelling:         Pulmonary    Oxygen at home:    Productive cough:     Wheezing:         Neurologic    Sudden weakness in arms or legs:     Sudden numbness in arms or legs:     Sudden onset of difficulty speaking or slurred speech:    Temporary loss of vision in one eye:     Problems with dizziness:         Gastrointestinal    Blood in stool:     Vomited blood:         Genitourinary    Burning when urinating:     Blood in urine:        Psychiatric    Major depression:         Hematologic    Bleeding problems:    Problems with blood clotting too easily:        Skin    Rashes or ulcers:        Constitutional    Fever or chills:     PHYSICAL EXAM:   Vitals:   03/21/23 1344 03/21/23 1348  BP: (!) 153/111 (!) 151/102  Pulse: (!) 107   Resp: 20   Temp: 98.4 F (36.9 C)   SpO2: 97%   Weight: 200 lb (90.7 kg)   Height: 5\' 3"  (1.6 m)     GENERAL: The patient is a well-nourished female, in no acute distress. The vital signs are documented above. CARDIAC: There is a regular rate and rhythm.  VASCULAR: I do not detect carotid bruits. Both feet are warm and well-perfused. PULMONARY: There is good air exchange bilaterally without wheezing or rales. ABDOMEN: Soft and non-tender with normal pitched bowel sounds.  MUSCULOSKELETAL: There are no major deformities or cyanosis. NEUROLOGIC: No focal weakness or paresthesias are detected. SKIN: There are no ulcers or rashes noted. PSYCHIATRIC: The patient has a normal affect.  DATA:  CAROTID DUPLEX: I have reviewed the carotid duplex scan that was done on 03/14/2023.  On the right side the ICA has a known occlusion.  The right vertebral artery is patent with antegrade flow.  On the left side there is a 40 to 59% stenosis.  The left vertebral artery is patent with antegrade flow.  Deitra Mayo [facility] [phone]

## 2023-03-28 ENCOUNTER — Telehealth: Payer: Self-pay

## 2023-03-28 NOTE — Telephone Encounter (Signed)
LM  and mailed a letter requesting a call back

## 2023-03-28 NOTE — Telephone Encounter (Signed)
Patient notified of US carotid results

## 2023-03-28 NOTE — Telephone Encounter (Signed)
-----   Message from Park Liter, MD sent at 03/14/2023  9:25 PM EDT ----- Carotic occluded on the right which we knew about, moderate stenosis on the left, no need to intervene

## 2023-04-08 ENCOUNTER — Ambulatory Visit: Payer: Managed Care, Other (non HMO) | Admitting: Cardiology

## 2023-05-23 ENCOUNTER — Ambulatory Visit: Payer: Managed Care, Other (non HMO) | Admitting: Family Medicine

## 2023-05-31 ENCOUNTER — Ambulatory Visit (INDEPENDENT_AMBULATORY_CARE_PROVIDER_SITE_OTHER): Payer: Managed Care, Other (non HMO) | Admitting: Family Medicine

## 2023-05-31 VITALS — Temp 98.2°F | Ht 63.0 in | Wt 201.4 lb

## 2023-05-31 DIAGNOSIS — E669 Obesity, unspecified: Secondary | ICD-10-CM

## 2023-05-31 DIAGNOSIS — R5383 Other fatigue: Secondary | ICD-10-CM | POA: Diagnosis not present

## 2023-05-31 DIAGNOSIS — I25118 Atherosclerotic heart disease of native coronary artery with other forms of angina pectoris: Secondary | ICD-10-CM | POA: Diagnosis not present

## 2023-05-31 DIAGNOSIS — I10 Essential (primary) hypertension: Secondary | ICD-10-CM | POA: Diagnosis not present

## 2023-05-31 DIAGNOSIS — T8543XS Leakage of breast prosthesis and implant, sequela: Secondary | ICD-10-CM

## 2023-05-31 DIAGNOSIS — Z1231 Encounter for screening mammogram for malignant neoplasm of breast: Secondary | ICD-10-CM

## 2023-05-31 DIAGNOSIS — E6609 Other obesity due to excess calories: Secondary | ICD-10-CM | POA: Insufficient documentation

## 2023-05-31 DIAGNOSIS — E66812 Obesity, class 2: Secondary | ICD-10-CM

## 2023-05-31 DIAGNOSIS — E559 Vitamin D deficiency, unspecified: Secondary | ICD-10-CM

## 2023-05-31 DIAGNOSIS — Z1211 Encounter for screening for malignant neoplasm of colon: Secondary | ICD-10-CM

## 2023-05-31 MED ORDER — WEGOVY 0.25 MG/0.5ML ~~LOC~~ SOAJ: 0.2500 mg | SUBCUTANEOUS | 0 refills | Status: DC

## 2023-05-31 MED ORDER — WEGOVY 0.5 MG/0.5ML ~~LOC~~ SOAJ: 0.5000 mg | SUBCUTANEOUS | 0 refills | Status: DC

## 2023-05-31 NOTE — Assessment & Plan Note (Signed)
Stable, despite some noncardiac chest pain. Cotninue aspirin 81 mg daily, Plavix 75 mg daily, nebivolol 5 mg daily, and isosorbide mononitrate 60 mg daily.

## 2023-05-31 NOTE — Assessment & Plan Note (Signed)
Ms. Schaible is fearful of general anesthesia. I feel she should try and have the ruptured implant removed. She is agreeable to speak with a surgeon about this.

## 2023-05-31 NOTE — Progress Notes (Signed)
[facility] [address] [place] [address] Dept: [phone] Dept Fax: [phone]  Chronic Care Office Visit  Subjective:    Patient ID: Samantha Russo, female    DOB: 09-Jun-1961, 62 y.o..   MRN: [id number]  Chief Complaint  Patient presents with   Medical Management of Chronic Issues    F/u to discuss back pain, weight gain, discuss BP, intermittent chest pains.    History of Present Illness:  Patient is in today for reassessment of chronic medical issues.  Samantha Russo notes she has had 55 lbs of weight gain in the past 2 years. She feels this has significantly contributed to her back issues and neuropathy. She states she has a history of prediabetes. She also has ahd extensive CV disease and notes a prior stroke. She is aware that semaglutide has evidence of reducing risk for stroke. She feels weight loss would help her manage her other health concerns. She is aware of the costs of semaglutide and this may not be covered by her insurance. She would like to initiate this therapy.  Samantha Russo has a history of CAD, with a 100% occlusion of her RCA. 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 She has hyperlipidemia and hypertension. She is currently on aspirin 81 mg daily and Plavix 75 mg daily related to her heart disease. She is on rosuvastatin 40 mg daily and ezetimibe 10 mg daily for her lipid management. She is also on nebivolol 5 mg daily. She does continue to have episodes of angina, for which she is on isosorbide mononitrate 60 mg daily.   Samantha Russo notes she has had prior breast implants. The left implant has ruptured and the breast is quite hard. She feels this needs to be removed, but is nervous about having the surgery in light of her cardiovascular issues.   Samantha Russo continues to complain of chronic fatigue. She notes her sister feels it relates to  her ruptured implant.  Past Medical History: Patient Active Problem List   Diagnosis Date Noted   Class 2 obesity 05/31/2023   TIA (transient ischemic attack) 03/05/2023   Heart attack (HCC) 03/05/2023   Carotid artery occlusion 03/05/2023   History of shingles, recurrent 02/13/2022   Anxiety 02/13/2022   Chronic headaches 02/13/2022   Fatigue 02/13/2022   Tobacco use disorder, continuous 02/13/2022   Aortic atherosclerosis (HCC) 02/13/2022   Left ventricular hypertrophy 02/13/2022   Breast implant leak 02/13/2022   Carotid arterial disease (HCC) 08/23/2021   Dyslipidemia 08/23/2021   Amaurosis fugax of right eye 08/18/2021   Coronary artery disease completely occluded RCA with nonobstructive disease elsewhere based on coronary CT 08/08/2021   Atypical chest pain 07/25/2021   Essential hypertension 07/24/2021   Chronic neck and back pain 07/24/2021   Greater trochanteric bursitis 03/11/2019   Cervical disc disorder with radiculopathy 08/09/2015   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/09/2015   Past Surgical History:  Procedure Laterality Date   PLACEMENT OF BREAST IMPLANTS     SPINAL FUSION     2012 2013   Family History  Problem Relation Age of Onset   Hyperlipidemia Mother    Hypertension Mother    Hyperlipidemia Father    Heart disease Father    Heart failure Father    Diabetes Father    Heart disease Maternal Aunt    Heart disease Maternal Grandmother    Stroke  Maternal Grandmother    Heart failure Maternal Grandmother    Heart disease Maternal Grandfather    Diabetes Paternal Grandmother    Stroke Paternal Grandmother    Heart disease Paternal Grandfather    Outpatient Medications Prior to Visit  Medication Sig Dispense Refill   aspirin EC 81 MG tablet Take 1 tablet (81 mg total) by mouth daily. Swallow whole. 90 tablet 3   clopidogrel (PLAVIX) 75 MG tablet Take 1 tablet (75 mg total) by mouth daily. 90 tablet 2   diazepam (VALIUM)  5 MG tablet Take 1 tablet (5 mg total) by mouth daily as needed for anxiety. 20 tablet 0   ezetimibe (ZETIA) 10 MG tablet TAKE 1 TABLET(10 MG) BY MOUTH DAILY (Patient taking differently: Take 10 mg by mouth daily.) 90 tablet 1   isosorbide mononitrate (IMDUR) 60 MG 24 hr tablet Take 1 tablet (60 mg total) by mouth daily. 90 tablet 3   nebivolol (BYSTOLIC) 5 MG tablet TAKE 1 TABLET(5 MG) BY MOUTH DAILY 90 tablet 2   nitroGLYCERIN (NITROLINGUAL) 0.4 MG/SPRAY spray Place 1 spray under the tongue every 5 (five) minutes x 3 doses as needed for chest pain. 30 g 11   rosuvastatin (CRESTOR) 40 MG tablet TAKE 1 TABLET(40 MG) BY MOUTH DAILY 90 tablet 2   spironolactone (ALDACTONE) 25 MG tablet TAKE 1 TABLET(25 MG) BY MOUTH DAILY 90 tablet 2   vitamin C (ASCORBIC ACID) 500 MG tablet Take 500 mg by mouth daily.     Vitamin D, Ergocalciferol, (DRISDOL) 1.25 MG (50000 UNIT) CAPS capsule Take 50,000 Units by mouth every 3 (three) days.     No facility-administered medications prior to visit.   No Known Allergies Objective:   Today's Vitals   05/31/23 1519  Temp: 98.2 F (36.8 C)  TempSrc: Temporal  Weight: 201 lb 6.4 oz (91.4 kg)  Height: 5\' 3"  (1.6 m)   Body mass index is 35.68 kg/m.   General: Well developed, well nourished. No acute distress. Psych: Alert and oriented. Normal mood and affect.  Health Maintenance Due  Topic Date Due   Hepatitis C Screening  Never done   PAP SMEAR-Modifier  Never done   Colonoscopy  Never done   MAMMOGRAM  Never done   Zoster Vaccines- Shingrix (1 of 2) Never done     Assessment & Plan:   Problem List Items Addressed This Visit       Cardiovascular and Mediastinum   Coronary artery disease completely occluded RCA with nonobstructive disease elsewhere based on coronary CT - Primary (Chronic)    Stable, despite some noncardiac chest pain. Cotninue aspirin 81 mg daily, Plavix 75 mg daily, nebivolol 5 mg daily, and isosorbide mononitrate 60 mg daily.       Essential hypertension    BP is improved from her prior levels, but still above goal. Continue nebivolol 5 mg daily. I will try to broach additional BP meds at her next visit.      Relevant Orders   Comprehensive metabolic panel     Other   Fatigue    Etiology remains unclear. This is likely multifactorial related to chronic diseases, including heart disease, mood disorder, and possibly an issue such as fibromyalgia and/or myalgic encephalomyelitis. I urge her to try and get adequate rest, eat a healthy diet, get regular exercise, and work at weight loss.      Relevant Orders   CBC   Comprehensive metabolic panel   TSH   Vitamin B12   VITAMIN  D 25 Hydroxy (Vit-D Deficiency, Fractures)   Breast implant leak    Samantha Russo is fearful of general anesthesia. I feel she should try and have the ruptured implant removed. She is agreeable to speak with a surgeon about this.      Relevant Orders   Ambulatory referral to Plastic Surgery   Class 2 obesity    Discussed options for medical management of obesity. I will prescribed Wegovy. I would like to see her back within 2 months for monitoring.      Relevant Medications   Semaglutide-Weight Management (WEGOVY) 0.25 MG/0.5ML SOAJ   Semaglutide-Weight Management (WEGOVY) 0.5 MG/0.5ML SOAJ   Other Visit Diagnoses     Screening for colon cancer       Relevant Orders   Cologuard   Encounter for screening mammogram for malignant neoplasm of breast       Relevant Orders   MM Digital Screening Unilat R       Return in about 2 months (around 07/31/2023) for Reassessment.   Loyola Mast, MD

## 2023-05-31 NOTE — Assessment & Plan Note (Signed)
Etiology remains unclear. This is likely multifactorial related to chronic diseases, including heart disease, mood disorder, and possibly an issue such as fibromyalgia and/or myalgic encephalomyelitis. I urge her to try and get adequate rest, eat a healthy diet, get regular exercise, and work at weight loss.

## 2023-05-31 NOTE — Assessment & Plan Note (Signed)
Discussed options for medical management of obesity. I will prescribed Wegovy. I would like to see her back within 2 months for monitoring.

## 2023-05-31 NOTE — Assessment & Plan Note (Addendum)
BP is improved from her prior levels, but still above goal. Continue nebivolol 5 mg daily. I will try to broach additional BP meds at her next visit.

## 2023-06-01 LAB — COMPREHENSIVE METABOLIC PANEL
AG Ratio: 1.5 (calc) (ref 1.0–2.5)
ALT: 24 U/L (ref 6–29)
AST: 19 U/L (ref 10–35)
Albumin: 4.6 g/dL (ref 3.6–5.1)
Alkaline phosphatase (APISO): 107 U/L (ref 37–153)
BUN: 17 mg/dL (ref 7–25)
CO2: 21 mmol/L (ref 20–32)
Calcium: 10 mg/dL (ref 8.6–10.4)
Chloride: 102 mmol/L (ref 98–110)
Creat: 0.61 mg/dL (ref 0.50–1.05)
Globulin: 3 g/dL (calc) (ref 1.9–3.7)
Glucose, Bld: 95 mg/dL (ref 65–99)
Potassium: 4.8 mmol/L (ref 3.5–5.3)
Sodium: 136 mmol/L (ref 135–146)
Total Bilirubin: 0.3 mg/dL (ref 0.2–1.2)
Total Protein: 7.6 g/dL (ref 6.1–8.1)

## 2023-06-01 LAB — CBC
HCT: 45.7 % — ABNORMAL HIGH (ref 35.0–45.0)
Hemoglobin: 15.5 g/dL (ref 11.7–15.5)
MCH: 29.4 pg (ref 27.0–33.0)
MCHC: 33.9 g/dL (ref 32.0–36.0)
MCV: 86.7 fL (ref 80.0–100.0)
MPV: 9.9 fL (ref 7.5–12.5)
Platelets: 378 10*3/uL (ref 140–400)
RBC: 5.27 10*6/uL — ABNORMAL HIGH (ref 3.80–5.10)
RDW: 13.7 % (ref 11.0–15.0)
WBC: 10.9 10*3/uL — ABNORMAL HIGH (ref 3.8–10.8)

## 2023-06-01 LAB — VITAMIN D 25 HYDROXY (VIT D DEFICIENCY, FRACTURES): Vit D, 25-Hydroxy: 15 ng/mL — ABNORMAL LOW (ref 30–100)

## 2023-06-01 LAB — VITAMIN B12: Vitamin B-12: 402 pg/mL (ref 200–1100)

## 2023-06-01 LAB — TSH: TSH: 4.14 mIU/L (ref 0.40–4.50)

## 2023-06-03 DIAGNOSIS — E559 Vitamin D deficiency, unspecified: Secondary | ICD-10-CM | POA: Insufficient documentation

## 2023-06-03 MED ORDER — VITAMIN D (ERGOCALCIFEROL) 1.25 MG (50000 UNIT) PO CAPS: 50000.0000 [IU] | ORAL_CAPSULE | ORAL | 2 refills | Status: DC

## 2023-06-03 NOTE — Addendum Note (Signed)
Addended by: Loyola Mast on: 06/03/2023 08:08 AM   Modules accepted: Orders

## 2023-06-04 ENCOUNTER — Telehealth: Payer: Self-pay

## 2023-06-04 NOTE — Telephone Encounter (Signed)
PA for Wegovy 0.25 mg/0.5 ml submitted through cover my meds.  Awaiting response. Dm/cma  Key: [employer]

## 2023-06-04 NOTE — Telephone Encounter (Signed)
Documents faxed to [facility] to process PA/appeal.  Dm/cma

## 2023-06-04 NOTE — Telephone Encounter (Signed)
Key: [id number]

## 2023-06-07 NOTE — Telephone Encounter (Signed)
Patient Advocate Encounter  Prior Authorization for [employer] 0.25MG /0.5ML PEN has been approved.    Effective: 06-05-2023 to 01-05-2024

## 2023-06-07 NOTE — Telephone Encounter (Signed)
Pharmacy notified VIA phone. Dm/cma  

## 2023-06-14 ENCOUNTER — Telehealth: Payer: Self-pay | Admitting: Family Medicine

## 2023-06-14 DIAGNOSIS — E669 Obesity, unspecified: Secondary | ICD-10-CM

## 2023-06-14 MED ORDER — WEGOVY 0.25 MG/0.5ML ~~LOC~~ SOAJ: 0.2500 mg | SUBCUTANEOUS | 0 refills | Status: DC

## 2023-06-14 MED ORDER — WEGOVY 0.5 MG/0.5ML ~~LOC~~ SOAJ: 0.5000 mg | SUBCUTANEOUS | 0 refills | Status: DC

## 2023-06-14 NOTE — Telephone Encounter (Signed)
[facility] and patient notified VIA phone. Dm/cma

## 2023-06-14 NOTE — Telephone Encounter (Signed)
Prescription Request  06/14/2023  LOV: 05/31/2023  What is the name of the medication or equipment? Semaglutide-Weight Management (WEGOVY) 0.5 MG/0.5ML Ivory Broad [[id number]]   Have you contacted your pharmacy to request a refill? No   Which pharmacy would you like this sent to?  [facility] [address]  [place] [address] and the # is [phone]   Patient notified that their request is being sent to the clinical staff for review and that they should receive a response within 2 business days.   Please advise at Mobile [phone] (mobile) Pt also want you to [facility] the [facility] [place] because it is cheaper .. please call the pt if any questions

## 2023-07-12 ENCOUNTER — Telehealth: Payer: Self-pay | Admitting: Family Medicine

## 2023-07-12 MED ORDER — ONDANSETRON HCL 4 MG PO TABS: 4.0000 mg | ORAL_TABLET | Freq: Three times a day (TID) | ORAL | 0 refills | Status: DC | PRN

## 2023-07-12 NOTE — Telephone Encounter (Signed)
Patient just took her first dose of Wegovy yesterday and is having some nausea and HA.  She would like to get a prescription for her nausea (Zofran) [facility].  Please review and advise.  Thanks.  Dm/cma

## 2023-07-12 NOTE — Telephone Encounter (Signed)
Pt called stating that the medication  (WEGOVY) 0.5 MG/0.5ML , makes her nauseous and would like that her medication be changed to an anit- nausea (Zofran). Pt wld like you to give her a call has she's got other questions.

## 2023-07-12 NOTE — Telephone Encounter (Signed)
Patient notified VIA phone and scheduled an appointment for 08/14/23. Dm/cma

## 2023-07-31 ENCOUNTER — Encounter (INDEPENDENT_AMBULATORY_CARE_PROVIDER_SITE_OTHER): Payer: Self-pay

## 2023-08-14 ENCOUNTER — Ambulatory Visit: Payer: Managed Care, Other (non HMO) | Admitting: Family Medicine

## 2023-09-21 ENCOUNTER — Other Ambulatory Visit: Payer: Self-pay | Admitting: Family Medicine

## 2023-09-21 DIAGNOSIS — E669 Obesity, unspecified: Secondary | ICD-10-CM

## 2023-09-27 NOTE — Telephone Encounter (Signed)
Lft VM to RTN call to schedule a follow up appointment. Dm/cma

## 2023-10-01 NOTE — Telephone Encounter (Signed)
10/15/2023 at 2:20 PM.  Dm/cma

## 2023-10-15 ENCOUNTER — Ambulatory Visit: Payer: Managed Care, Other (non HMO) | Admitting: Family Medicine

## 2023-10-15 VITALS — BP 122/84 | HR 97 | Temp 98.7°F | Ht 63.0 in | Wt 190.4 lb

## 2023-10-15 DIAGNOSIS — E559 Vitamin D deficiency, unspecified: Secondary | ICD-10-CM

## 2023-10-15 DIAGNOSIS — I10 Essential (primary) hypertension: Secondary | ICD-10-CM

## 2023-10-15 DIAGNOSIS — Z6833 Body mass index (BMI) 33.0-33.9, adult: Secondary | ICD-10-CM

## 2023-10-15 DIAGNOSIS — I25118 Atherosclerotic heart disease of native coronary artery with other forms of angina pectoris: Secondary | ICD-10-CM | POA: Diagnosis not present

## 2023-10-15 DIAGNOSIS — E6609 Other obesity due to excess calories: Secondary | ICD-10-CM

## 2023-10-15 DIAGNOSIS — E66811 Obesity, class 1: Secondary | ICD-10-CM

## 2023-10-15 MED ORDER — VITAMIN D (ERGOCALCIFEROL) 1.25 MG (50000 UNIT) PO CAPS: 50000.0000 [IU] | ORAL_CAPSULE | ORAL | 2 refills | Status: DC

## 2023-10-15 MED ORDER — WEGOVY 1 MG/0.5ML ~~LOC~~ SOAJ: 1.0000 mg | SUBCUTANEOUS | 1 refills | Status: AC

## 2023-10-15 MED ORDER — NITROGLYCERIN 0.4 MG/SPRAY TL SOLN: 1.0000 | 11 refills | Status: AC | PRN

## 2023-10-15 NOTE — Assessment & Plan Note (Signed)
Stablen. Cotninue aspirin 81 mg daily, Plavix 75 mg daily, nebivolol 5 mg daily, and isosorbide mononitrate 60 mg daily. I will renew her nitroglycerin spray.

## 2023-10-15 NOTE — Assessment & Plan Note (Signed)
BP is adequately controlled. Weight loss is likely helping with this. Continue nebivolol 5 mg daily.

## 2023-10-15 NOTE — Progress Notes (Signed)
[facility] [address] [place] [address] Dept: [phone] Dept Fax: [phone]  Chronic Care Office Visit  Subjective:    Patient ID: Samantha Russo, female    DOB: 1961/07/14, 62 y.o..   MRN: [id number]  Chief Complaint  Patient presents with   Hypertension    F/u HTN/weight.  No concerns.      History of Present Illness:  Patient is in today for reassessment of chronic medical issues.  Samantha Russo has a history of a 55 lb weight gain in the past 2 years, contributing to her back issues and neuropathy. She also has prediabetes and extensive CV disease. She has committed to paying for semaglutide out of pocket. She notes she has struggled with side effects initially. For the first 6 weeks, she had some nausea and vomiting regularly. Now this only seems to occur when she drinks alcohol. She has also had some constipation. She is not using a fiber supplement. She has only had rare diarrhea issues.   Samantha Russo has a history of CAD, with a 100% occlusion of her RCA. 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 She is currently on aspirin 81 mg daily and Plavix 75 mg daily related to her heart disease. She notes her nitroglycerin spray has expired.  She is on rosuvastatin 40 mg daily and ezetimibe 10 mg daily for her lipid management. She is also on nebivolol 5 mg daily. She does continue to have episodes of angina, for which she is on isosorbide mononitrate 60 mg daily.   Past Medical History: Patient Active Problem List   Diagnosis Date Noted   Vitamin D deficiency 06/03/2023   Class 1 obesity due to excess calories with body mass index (BMI) of 33.0 to 33.9 in adult 05/31/2023   TIA (transient ischemic attack) 03/05/2023   Heart attack (HCC) 03/05/2023   Carotid artery occlusion 03/05/2023   History of shingles, recurrent 02/13/2022   Anxiety 02/13/2022    Chronic headaches 02/13/2022   Fatigue 02/13/2022   Tobacco use disorder, continuous 02/13/2022   Aortic atherosclerosis (HCC) 02/13/2022   Left ventricular hypertrophy 02/13/2022   Breast implant leak 02/13/2022   Carotid arterial disease (HCC) 08/23/2021   Dyslipidemia 08/23/2021   Amaurosis fugax of right eye 08/18/2021   Coronary artery disease completely occluded RCA with nonobstructive disease elsewhere based on coronary CT 08/08/2021   Atypical chest pain 07/25/2021   Essential hypertension 07/24/2021   Chronic neck and back pain 07/24/2021   Greater trochanteric bursitis 03/11/2019   Cervical disc disorder with radiculopathy 08/09/2015   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/09/2015   Past Surgical History:  Procedure Laterality Date   PLACEMENT OF BREAST IMPLANTS     SPINAL FUSION     2012 2013   Family History  Problem Relation Age of Onset   Hyperlipidemia Mother    Hypertension Mother    Hyperlipidemia Father    Heart disease Father    Heart failure Father    Diabetes Father    Heart disease Maternal Aunt    Heart disease Maternal Grandmother    Stroke Maternal Grandmother    Heart failure Maternal Grandmother    Heart disease Maternal Grandfather    Diabetes Paternal Grandmother    Stroke Paternal Grandmother    Heart disease Paternal Grandfather    Outpatient Medications Prior to Visit  Medication Sig Dispense Refill  aspirin EC 81 MG tablet Take 1 tablet (81 mg total) by mouth daily. Swallow whole. 90 tablet 3   clopidogrel (PLAVIX) 75 MG tablet Take 1 tablet (75 mg total) by mouth daily. 90 tablet 2   diazepam (VALIUM) 5 MG tablet Take 1 tablet (5 mg total) by mouth daily as needed for anxiety. 20 tablet 0   ezetimibe (ZETIA) 10 MG tablet TAKE 1 TABLET(10 MG) BY MOUTH DAILY (Patient taking differently: Take 10 mg by mouth daily.) 90 tablet 1   isosorbide mononitrate (IMDUR) 60 MG 24 hr tablet Take 1 tablet (60 mg total) by mouth  daily. 90 tablet 3   nebivolol (BYSTOLIC) 5 MG tablet TAKE 1 TABLET(5 MG) BY MOUTH DAILY 90 tablet 2   ondansetron (ZOFRAN) 4 MG tablet Take 1 tablet (4 mg total) by mouth every 8 (eight) hours as needed for nausea or vomiting. 20 tablet 0   rosuvastatin (CRESTOR) 40 MG tablet TAKE 1 TABLET(40 MG) BY MOUTH DAILY 90 tablet 2   spironolactone (ALDACTONE) 25 MG tablet TAKE 1 TABLET(25 MG) BY MOUTH DAILY 90 tablet 2   vitamin C (ASCORBIC ACID) 500 MG tablet Take 500 mg by mouth daily.     nitroGLYCERIN (NITROLINGUAL) 0.4 MG/SPRAY spray Place 1 spray under the tongue every 5 (five) minutes x 3 doses as needed for chest pain. 30 g 11   Semaglutide-Weight Management (WEGOVY) 0.25 MG/0.5ML SOAJ Inject 0.25 mg into the skin once a week. 2 mL 0   Vitamin D, Ergocalciferol, (DRISDOL) 1.25 MG (50000 UNIT) CAPS capsule Take 1 capsule (50,000 Units total) by mouth every 3 (three) days. 5 capsule 2   WEGOVY 0.5 MG/0.5ML SOAJ ADMINISTER 0.5 MG UNDER THE SKIN 1 TIME A WEEK. START AFTER FIRST MONTH ON 0.25 MG DOSE 2 mL 0   No facility-administered medications prior to visit.   No Known Allergies Objective:   Today's Vitals   10/15/23 1421  BP: 122/84  Pulse: 97  Temp: 98.7 F (37.1 C)  TempSrc: Temporal  SpO2: 99%  Weight: 190 lb 6.4 oz (86.4 kg)  Height: 5\' 3"  (1.6 m)   Body mass index is 33.73 kg/m.   General: Well developed, well nourished. No acute distress. Psych: Alert and oriented. Normal mood and affect.  Health Maintenance Due  Topic Date Due   Hepatitis C Screening  Never done   Cervical Cancer Screening (HPV/Pap Cotest)  Never done   Colonoscopy  Never done   MAMMOGRAM  Never done   Zoster Vaccines- Shingrix (1 of 2) Never done      Assessment & Plan:   Problem List Items Addressed This Visit       Cardiovascular and Mediastinum   Coronary artery disease completely occluded RCA with nonobstructive disease elsewhere based on coronary CT - Primary (Chronic)    Stablen.  Cotninue aspirin 81 mg daily, Plavix 75 mg daily, nebivolol 5 mg daily, and isosorbide mononitrate 60 mg daily. I will renew her nitroglycerin spray.      Relevant Medications   nitroGLYCERIN (NITROLINGUAL) 0.4 MG/SPRAY spray   Essential hypertension    BP is adequately controlled. Weight loss is likely helping with this. Continue nebivolol 5 mg daily.      Relevant Medications   nitroGLYCERIN (NITROLINGUAL) 0.4 MG/SPRAY spray     Other   Class 1 obesity due to excess calories with body mass index (BMI) of 33.0 to 33.9 in adult    Maximum weight: 201 lbs (05/2023) Current weight: 190 lbs Weight change  since last visit: - 11 lbs Total weight loss: - 11 lbs (5.4%)  We will try increasing her Wegovy dose to 1 mg weekly. Continue current weight loss efforts.       Relevant Medications   Semaglutide-Weight Management (WEGOVY) 1 MG/0.5ML SOAJ   Vitamin D deficiency   Relevant Medications   Vitamin D, Ergocalciferol, (DRISDOL) 1.25 MG (50000 UNIT) CAPS capsule    Return in about 3 months (around 01/15/2024) for Reassessment.   Loyola Mast, MD

## 2023-10-15 NOTE — Assessment & Plan Note (Signed)
Maximum weight: 201 lbs (05/2023) Current weight: 190 lbs Weight change since last visit: - 11 lbs Total weight loss: - 11 lbs (5.4%)  We will try increasing her Wegovy dose to 1 mg weekly. Continue current weight loss efforts.

## 2023-10-18 ENCOUNTER — Other Ambulatory Visit: Payer: Self-pay | Admitting: Cardiology

## 2023-12-04 ENCOUNTER — Other Ambulatory Visit: Payer: Self-pay | Admitting: Family Medicine

## 2023-12-04 DIAGNOSIS — E559 Vitamin D deficiency, unspecified: Secondary | ICD-10-CM

## 2023-12-10 ENCOUNTER — Telehealth: Payer: Self-pay | Admitting: Family Medicine

## 2023-12-10 DIAGNOSIS — E559 Vitamin D deficiency, unspecified: Secondary | ICD-10-CM

## 2023-12-10 NOTE — Telephone Encounter (Signed)
Pt has rescheduled her appt to 12/18 and is requesting labs before this appt so you and she can discuss when she comes in. There are no orders so I did not schedule.

## 2023-12-11 NOTE — Telephone Encounter (Signed)
Absolutely.

## 2023-12-11 NOTE — Addendum Note (Signed)
Addended by: Loyola Mast on: 12/11/2023 10:00 AM   Modules accepted: Orders

## 2023-12-11 NOTE — Telephone Encounter (Signed)
You can schedule he now for her lab appointment. Dm/cma

## 2023-12-16 ENCOUNTER — Other Ambulatory Visit: Payer: Managed Care, Other (non HMO)

## 2023-12-18 ENCOUNTER — Ambulatory Visit: Payer: Managed Care, Other (non HMO) | Admitting: Family Medicine

## 2023-12-19 ENCOUNTER — Other Ambulatory Visit: Payer: Self-pay | Admitting: Cardiology

## 2024-01-15 ENCOUNTER — Ambulatory Visit: Payer: Managed Care, Other (non HMO) | Admitting: Family Medicine

## 2024-05-12 ENCOUNTER — Ambulatory Visit: Attending: Cardiology | Admitting: Cardiology

## 2024-05-12 ENCOUNTER — Encounter: Payer: Self-pay | Admitting: Cardiology

## 2024-05-12 VITALS — BP 126/84 | HR 96 | Ht 63.0 in | Wt 188.0 lb

## 2024-05-12 DIAGNOSIS — I10 Essential (primary) hypertension: Secondary | ICD-10-CM

## 2024-05-12 DIAGNOSIS — F17209 Nicotine dependence, unspecified, with unspecified nicotine-induced disorders: Secondary | ICD-10-CM

## 2024-05-12 DIAGNOSIS — E785 Hyperlipidemia, unspecified: Secondary | ICD-10-CM

## 2024-05-12 DIAGNOSIS — I25118 Atherosclerotic heart disease of native coronary artery with other forms of angina pectoris: Secondary | ICD-10-CM | POA: Diagnosis not present

## 2024-05-12 DIAGNOSIS — R0789 Other chest pain: Secondary | ICD-10-CM | POA: Diagnosis not present

## 2024-05-12 DIAGNOSIS — I6529 Occlusion and stenosis of unspecified carotid artery: Secondary | ICD-10-CM | POA: Diagnosis not present

## 2024-05-12 NOTE — Progress Notes (Signed)
 Cardiology Office Note:    Date:  05/12/2024   ID:  Samantha Russo, DOB 1961-07-20, MRN [id number]  PCP:  Graig Lawyer, MD  Cardiologist:  Ralene Burger, MD    Referring MD: Graig Lawyer, MD   Chief Complaint  Patient presents with   Medication Refill    All cardiac meds     History of Present Illness:    Samantha Russo is a 63 y.o. female   with complex past medical history.  Apparently she relocated to our area from [place]  she was referred to me because of episode of chest pain.  She does have multiple risk factors for coronary artery disease including hypertension, dyslipidemia very high LDL that initially was not treated smoking.  She did have echocardiogram done which showed normal left ventricle ejection fraction left-ventricular hypertrophy.  There were no segmental motion abnormalities, after that she did not coronary CT angio which showed completely occluded right coronary artery with nonobstructive mild to moderate disease elsewhere.  Decision has been made to treat her medically.  She was put on antiplatelet therapy as well as beta-blocker and statin.  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  However cervical and brain CT angiogram showed completely occluded right internal carotic artery at the base, therefore, surgery has not been indicated.  She also got normal antegrade flow within vertebral arteries.  Today to my office for follow-up overall doing fair she said really she has not used nitroglycerin  it usually happen at rest.  She can walk climb stairs  with no difficulties  Past Medical History:  Diagnosis Date   Atypical chest pain 07/25/2021   Carotid artery occlusion    Cervical disc disorder with radiculopathy 08/09/2015   Chronic neck and back pain    Heart attack (HCC)    Hypertension    Smoking 07/25/2021   TIA (transient ischemic attack)     Past Surgical History:  Procedure Laterality Date   PLACEMENT OF BREAST IMPLANTS     SPINAL FUSION     2012 2013    Current Medications: Current Meds  Medication Sig   aspirin  EC 81 MG tablet Take 1 tablet (81 mg total) by mouth daily. Swallow whole.   clopidogrel  (PLAVIX ) 75 MG tablet TAKE 1 TABLET(75 MG) BY MOUTH DAILY (Patient taking differently: Take 75 mg by mouth daily.)   diazepam  (VALIUM ) 5 MG tablet Take 1 tablet (5 mg total) by mouth daily as needed for anxiety.   ezetimibe  (ZETIA ) 10 MG tablet TAKE 1 TABLET(10 MG) BY MOUTH DAILY (Patient taking differently: Take 10 mg by mouth daily.)   isosorbide  mononitrate (IMDUR ) 60 MG 24 hr tablet TAKE 1 TABLET(60 MG) BY MOUTH DAILY (Patient taking differently: Take 60 mg by mouth daily.)   nebivolol  (BYSTOLIC ) 5 MG tablet TAKE 1 TABLET(5 MG) BY MOUTH DAILY (Patient taking differently: Take 5 mg by mouth daily.)   nitroGLYCERIN  (NITROLINGUAL ) 0.4 MG/SPRAY spray Place 1 spray under the tongue every 5 (five) minutes x 3 doses as needed for chest pain.   ondansetron  (ZOFRAN ) 4 MG tablet Take  1 tablet (4 mg total) by mouth every 8 (eight) hours as needed for nausea or vomiting.   rosuvastatin  (CRESTOR ) 40 MG tablet TAKE 1 TABLET(40 MG) BY MOUTH DAILY (Patient taking differently: Take 40 mg by mouth daily. TAKE 1 TABLET(40 MG) BY MOUTH DAILY)   Semaglutide -Weight Management (WEGOVY ) 1 MG/0.5ML SOAJ Inject 1 mg into the skin once a week.   spironolactone  (ALDACTONE ) 25 MG tablet TAKE 1 TABLET(25 MG) BY MOUTH DAILY (Patient taking differently: Take 25 mg by mouth daily.)   vitamin C (ASCORBIC ACID) 500 MG tablet Take 500 mg by mouth daily.    Vitamin D , Ergocalciferol , (DRISDOL ) 1.25 MG (50000 UNIT) CAPS capsule TAKE 1 CAPSULE BY MOUTH EVERY 3 DAYS (Patient taking differently: Take 50,000 Units by mouth every 7 (seven) days.)     Allergies:   Patient has no known allergies.   Social History   Socioeconomic History   Marital status: Married    Spouse name: Not on file   Number of children: 0   Years of education: Not on file   Highest education level: Associate degree: occupational, Scientist, product/process development, or vocational program  Occupational History   Occupation: Retired  Tobacco Use   Smoking status: Every Day    Current packs/day: 0.10    Types: Cigarettes   Smokeless tobacco: Never   Tobacco comments:    Less than 4 cigs a week  Vaping Use   Vaping status: Never Used  Substance and Sexual Activity   Alcohol use: Yes    Alcohol/week: 4.0 standard drinks of alcohol    Types: 4 Glasses of wine per week   Drug use: Never   Sexual activity: Yes  Other Topics Concern   Not on file  Social History Narrative   2 step-children in the [place]   Social Drivers of Health   Financial Resource Strain: Low Risk  (10/15/2023)   Overall Financial Resource Strain (CARDIA)    Difficulty of Paying Living Expenses: Not hard at all  Food Insecurity: No Food Insecurity (10/15/2023)   Hunger Vital Sign    Worried About Running Out of Food in the Last Year: Never true    Ran Out of Food in the Last Year: Never true  Transportation Needs: No Transportation Needs (10/15/2023)   PRAPARE - Administrator, Civil Service (Medical): No    Lack of Transportation (Non-Medical): No  Physical Activity: Insufficiently Active (10/15/2023)   Exercise Vital Sign    Days of Exercise per Week: 1 day    Minutes of Exercise per Session: 20 min  Stress: No Stress Concern Present (10/15/2023)   [employer] of Occupational Health - Occupational Stress Questionnaire    Feeling of Stress : Not at all  Social Connections: Moderately  Integrated (10/15/2023)   Social Connection and Isolation Panel [NHANES]    Frequency of Communication with Friends and Family: Three times a week    Frequency of Social Gatherings with Friends and Family: Once a week    Attends Religious Services: 1 to 4 times per year    Active Member of [employer] or Organizations: No    Attends Engineer, structural: Not on file    Marital Status: Married     Family History: The patient's family history includes Diabetes in her father and paternal grandmother; Heart disease in her father, maternal aunt, maternal grandfather, maternal grandmother, and paternal grandfather; Heart failure in her father and maternal grandmother; Hyperlipidemia in her father and mother; Hypertension in  her mother; Stroke in her maternal grandmother and paternal grandmother. ROS:   Please see the history of present illness.    All 14 point review of systems negative except as described per history of present illness  EKGs/Labs/Other Studies Reviewed:    EKG Interpretation Date/Time:  Tuesday May 12 2024 14:20:46 EDT Ventricular Rate:  95 PR Interval:  156 QRS Duration:  76 QT Interval:  370 QTC Calculation: 464 R Axis:   51  Text Interpretation: Normal sinus rhythm Normal ECG When compared with ECG of 18-Aug-2021 17:32, No significant change was found Confirmed by Ralene Burger [phone]) on 05/12/2024 2:26:59 PM    Recent Labs: 05/31/2023: ALT 24; BUN 17; Creat 0.61; Hemoglobin 15.5; Platelets 378; Potassium 4.8; Sodium 136; TSH 4.14  Recent Lipid Panel    Component Value Date/Time   CHOL 231 (H) 09/06/2022 0944   TRIG 151 (H) 09/06/2022 0944   HDL 62 09/06/2022 0944   CHOLHDL 3.7 09/06/2022 0944   LDLCALC 142 (H) 09/06/2022 0944    Physical Exam:    VS:  BP 126/84 (BP Location: Right Arm, Patient Position: Sitting)   Pulse 96   Ht 5\' 3"  (1.6 m)   Wt 188 lb (85.3 kg)   LMP  (LMP Unknown)   SpO2 97%   BMI 33.30 kg/m     Wt Readings from Last 3  Encounters:  05/12/24 188 lb (85.3 kg)  10/15/23 190 lb 6.4 oz (86.4 kg)  05/31/23 201 lb 6.4 oz (91.4 kg)     GEN:  Well nourished, well developed in no acute distress HEENT: Normal NECK: No JVD; No carotid bruits LYMPHATICS: No lymphadenopathy CARDIAC: RRR, no murmurs, no rubs, no gallops RESPIRATORY:  Clear to auscultation without rales, wheezing or rhonchi  ABDOMEN: Soft, non-tender, non-distended MUSCULOSKELETAL:  No edema; No deformity  SKIN: Warm and dry LOWER EXTREMITIES: no swelling NEUROLOGIC:  Alert and oriented x 3 PSYCHIATRIC:  Normal affect   ASSESSMENT:    1. Atypical chest pain   2. Essential hypertension   3. Occlusion of carotid artery, unspecified laterality   4. Coronary artery disease of native artery of native heart with stable angina pectoris (HCC)   5. Tobacco use disorder, continuous   6. Dyslipidemia    PLAN:    In order of problems listed above:  Coronary disease doing well from that point review she does have atypical chest pain that happens at rest at the same time she can walk climb stairs with no difficulties she does have some chronic back problem with slow her down somewhat but still perform quite well Essential hypertension: Blood pressure well-controlled continue present management. Occluded carotic artery, I will schedule him to have carotic ultrasound. Dyspnea on exertion will do echocardiogram. Dyslipidemia fasting lipid profile need to be done   Medication Adjustments/Labs and Tests Ordered: Current medicines are reviewed at length with the patient today.  Concerns regarding medicines are outlined above.  Orders Placed This Encounter  Procedures   EKG 12-Lead   Medication changes: No orders of the defined types were placed in this encounter.   Signed, Manfred Seed, MD, [facility] 05/12/2024 2:46 PM    [facility]

## 2024-05-12 NOTE — Addendum Note (Signed)
 Addended by: Aurelio Leer I on: 05/12/2024 03:03 PM   Modules accepted: Orders

## 2024-05-12 NOTE — Patient Instructions (Signed)
 Medication Instructions:  Your physician recommends that you continue on your current medications as directed. Please refer to the Current Medication list given to you today.  *If you need a refill on your cardiac medications before your next appointment, please call your pharmacy*  Lab Work: Your physician recommends that you return for lab work in:   Labs today: Lipid, CMP, TSH, CBC  If you have labs (blood work) drawn today and your tests are completely normal, you will receive your results only by: MyChart Message (if you have MyChart) OR A paper copy in the mail If you have any lab test that is abnormal or we need to change your treatment, we will call you to review the results.  Testing/Procedures: Your physician has requested that you have an echocardiogram. Echocardiography is a painless test that uses sound waves to create images of your heart. It provides your doctor with information about the size and shape of your heart and how well your heart's chambers and valves are working. This procedure takes approximately one hour. There are no restrictions for this procedure. Please do NOT wear cologne, perfume, aftershave, or lotions (deodorant is allowed). Please arrive 15 minutes prior to your appointment time.  Please note: We ask at that you not bring children with you during ultrasound (echo/ vascular) testing. Due to room size and safety concerns, children are not allowed in the ultrasound rooms during exams. Our front office staff cannot provide observation of children in our lobby area while testing is being conducted. An adult accompanying a patient to their appointment will only be allowed in the ultrasound room at the discretion of the ultrasound technician under special circumstances. We apologize for any inconvenience.  Your physician has requested that you have a carotid duplex. This test is an ultrasound of the carotid arteries in your neck. It looks at blood flow through  these arteries that supply the brain with blood. Allow one hour for this exam. There are no restrictions or special instructions.   Follow-Up: At [facility], you and your health needs are our priority.  As part of our continuing mission to provide you with exceptional heart care, our providers are all part of one team.  This team includes your primary Cardiologist (physician) and Advanced Practice Providers or APPs (Physician Assistants and Nurse Practitioners) who all work together to provide you with the care you need, when you need it.  Your next appointment:   6 month(s)  Provider:   Ralene Burger, MD    We recommend signing up for the patient portal called "MyChart".  Sign up information is provided on this After Visit Summary.  MyChart is used to connect with patients for Virtual Visits (Telemedicine).  Patients are able to view lab/test results, encounter notes, upcoming appointments, etc.  Non-urgent messages can be sent to your provider as well.   To learn more about what you can do with MyChart, go to [email or web address].   Other Instructions None

## 2024-05-13 LAB — LIPID PANEL

## 2024-05-14 LAB — COMPREHENSIVE METABOLIC PANEL WITH GFR
ALT: 24 IU/L (ref 0–32)
AST: 21 IU/L (ref 0–40)
Albumin: 4.8 g/dL (ref 3.9–4.9)
Alkaline Phosphatase: 118 IU/L (ref 44–121)
BUN/Creatinine Ratio: 29 — ABNORMAL HIGH (ref 12–28)
BUN: 22 mg/dL (ref 8–27)
CO2: 20 mmol/L (ref 20–29)
Calcium: 9.6 mg/dL (ref 8.7–10.3)
Chloride: 100 mmol/L (ref 96–106)
Creatinine, Ser: 0.76 mg/dL (ref 0.57–1.00)
Globulin, Total: 3 g/dL (ref 1.5–4.5)
Glucose: 93 mg/dL (ref 70–99)
Potassium: 5.2 mmol/L (ref 3.5–5.2)
Sodium: 136 mmol/L (ref 134–144)
Total Protein: 7.8 g/dL (ref 6.0–8.5)
eGFR: 89 mL/min/{1.73_m2} (ref >59)

## 2024-05-14 LAB — CBC
Hematocrit: 47 % — ABNORMAL HIGH (ref 34.0–46.6)
Hemoglobin: 15.6 g/dL (ref 11.1–15.9)
MCH: 30.1 pg (ref 26.6–33.0)
MCHC: 33.2 g/dL (ref 31.5–35.7)
MCV: 91 fL (ref 79–97)
Platelets: 353 10*3/uL (ref 150–450)
RBC: 5.19 x10E6/uL (ref 3.77–5.28)
RDW: 13.3 % (ref 11.7–15.4)
WBC: 10.3 10*3/uL (ref 3.4–10.8)

## 2024-05-14 LAB — LIPID PANEL
Cholesterol, Total: 245 mg/dL — ABNORMAL HIGH (ref 100–199)
HDL: 60 mg/dL (ref >39)
LDL CALC COMMENT:: 4.1 ratio (ref 0.0–4.4)
LDL Chol Calc (NIH): 163 mg/dL — ABNORMAL HIGH (ref 0–99)
Triglycerides: 125 mg/dL (ref 0–149)
VLDL Cholesterol Cal: 22 mg/dL (ref 5–40)

## 2024-05-14 LAB — TSH: TSH: 2.08 u[IU]/mL (ref 0.450–4.500)

## 2024-05-21 ENCOUNTER — Ambulatory Visit: Payer: Self-pay | Admitting: Cardiology

## 2024-05-29 ENCOUNTER — Other Ambulatory Visit: Payer: Self-pay | Admitting: Cardiology

## 2024-06-03 ENCOUNTER — Telehealth: Payer: Self-pay

## 2024-06-03 NOTE — Telephone Encounter (Signed)
 Lab Results reviewed with pt as per Dr. Vanetta Shawl note.  Pt verbalized understanding and had no additional questions. Routed to PCP

## 2024-06-09 ENCOUNTER — Other Ambulatory Visit: Payer: Self-pay | Admitting: Cardiology

## 2024-06-09 DIAGNOSIS — I6529 Occlusion and stenosis of unspecified carotid artery: Secondary | ICD-10-CM

## 2024-06-09 DIAGNOSIS — E785 Hyperlipidemia, unspecified: Secondary | ICD-10-CM

## 2024-06-09 DIAGNOSIS — I25118 Atherosclerotic heart disease of native coronary artery with other forms of angina pectoris: Secondary | ICD-10-CM

## 2024-06-09 DIAGNOSIS — I10 Essential (primary) hypertension: Secondary | ICD-10-CM

## 2024-06-09 DIAGNOSIS — R0789 Other chest pain: Secondary | ICD-10-CM

## 2024-06-09 DIAGNOSIS — F17209 Nicotine dependence, unspecified, with unspecified nicotine-induced disorders: Secondary | ICD-10-CM

## 2024-06-22 ENCOUNTER — Ambulatory Visit (HOSPITAL_BASED_OUTPATIENT_CLINIC_OR_DEPARTMENT_OTHER)
Admission: RE | Admit: 2024-06-22 | Discharge: 2024-06-22 | Disposition: A | Discharge status: Home / Self Care | Source: Ambulatory Visit | Attending: Cardiology | Admitting: Cardiology

## 2024-06-22 ENCOUNTER — Ambulatory Visit (HOSPITAL_BASED_OUTPATIENT_CLINIC_OR_DEPARTMENT_OTHER)
Admission: RE | Admit: 2024-06-22 | Discharge: 2024-06-22 | Discharge status: Home / Self Care | Source: Ambulatory Visit | Attending: Cardiology | Admitting: Cardiology

## 2024-06-22 DIAGNOSIS — I517 Cardiomegaly: Secondary | ICD-10-CM | POA: Diagnosis not present

## 2024-06-22 DIAGNOSIS — R0789 Other chest pain: Secondary | ICD-10-CM | POA: Insufficient documentation

## 2024-06-22 DIAGNOSIS — I6523 Occlusion and stenosis of bilateral carotid arteries: Secondary | ICD-10-CM | POA: Diagnosis not present

## 2024-06-22 DIAGNOSIS — I6529 Occlusion and stenosis of unspecified carotid artery: Secondary | ICD-10-CM

## 2024-06-22 DIAGNOSIS — I25118 Atherosclerotic heart disease of native coronary artery with other forms of angina pectoris: Secondary | ICD-10-CM | POA: Insufficient documentation

## 2024-06-24 ENCOUNTER — Ambulatory Visit: Payer: Self-pay | Admitting: Cardiology

## 2024-06-24 LAB — ECHOCARDIOGRAM COMPLETE
AR max vel: 1.95 cm2
AV Area VTI: 1.78 cm2
AV Area mean vel: 1.75 cm2
AV Mean grad: 6 mmHg
AV Peak grad: 9.5 mmHg
Ao pk vel: 1.54 m/s
Area-P 1/2: 3.85 cm2
Calc EF: 61.4 %
S' Lateral: 2.3 cm
Single Plane A2C EF: 61 %
Single Plane A4C EF: 62.6 %

## 2024-06-24 NOTE — Telephone Encounter (Signed)
 Pt requesting cb with results

## 2024-06-30 ENCOUNTER — Ambulatory Visit: Payer: Self-pay | Admitting: Cardiology

## 2024-07-14 ENCOUNTER — Telehealth: Payer: Self-pay

## 2024-07-14 NOTE — Telephone Encounter (Signed)
 Left message on My Chart with Carotid US  results per Dr. Karry note. Routed to PCP.

## 2024-07-14 NOTE — Telephone Encounter (Signed)
 Left message on My Chart with Echo results per Dr. Vanetta Shawl note. Routed to PCP.

## 2024-07-27 ENCOUNTER — Other Ambulatory Visit: Payer: Self-pay | Admitting: Cardiology

## 2024-09-25 ENCOUNTER — Telehealth: Payer: Self-pay | Admitting: Family Medicine

## 2024-09-25 NOTE — Telephone Encounter (Signed)
 Called and spoke to patient. She states that she is having pain/boil on one of her breast and it is leaking/oozing fluid.  Advised her that she needs to have an appointment some where to be checked out.  She will go to the Urgent care or ED to have it looked at. Dm/cma

## 2024-09-25 NOTE — Telephone Encounter (Signed)
 Pt would like for dr rudd  to give her a call . Pt did not mention about what she she she have a couple of questions that is going on with her

## 2024-10-21 ENCOUNTER — Other Ambulatory Visit: Payer: Self-pay | Admitting: Family Medicine

## 2024-10-21 ENCOUNTER — Other Ambulatory Visit: Payer: Self-pay | Admitting: Cardiology

## 2024-10-21 DIAGNOSIS — E559 Vitamin D deficiency, unspecified: Secondary | ICD-10-CM

## 2024-10-21 DIAGNOSIS — I25118 Atherosclerotic heart disease of native coronary artery with other forms of angina pectoris: Secondary | ICD-10-CM

## 2024-10-22 ENCOUNTER — Ambulatory Visit: Admitting: Nurse Practitioner

## 2024-10-29 ENCOUNTER — Ambulatory Visit (INDEPENDENT_AMBULATORY_CARE_PROVIDER_SITE_OTHER): Admitting: Nurse Practitioner

## 2024-10-29 VITALS — BP 138/92 | HR 91 | Temp 98.1°F | Ht 63.0 in

## 2024-10-29 DIAGNOSIS — Z9882 Breast implant status: Secondary | ICD-10-CM

## 2024-10-29 DIAGNOSIS — E559 Vitamin D deficiency, unspecified: Secondary | ICD-10-CM

## 2024-10-29 DIAGNOSIS — N644 Mastodynia: Secondary | ICD-10-CM | POA: Insufficient documentation

## 2024-10-29 MED ORDER — CEPHALEXIN 500 MG PO CAPS: 500.0000 mg | ORAL_CAPSULE | Freq: Two times a day (BID) | ORAL | 0 refills | Status: AC

## 2024-10-29 NOTE — Assessment & Plan Note (Signed)
 She declines to have her labs checked today. Recommend she start a vitamin D  supplement OTC 2,000 units daily.

## 2024-10-29 NOTE — Patient Instructions (Signed)
 It was great to see you!  I have placed an order for mammogram, they should call you to schedule. If you do not hear from them in the next week, please call:  [facility] [address] [place], [address] [place], [place] [phone]  Start keflex twice a day for 10 days. Stop the hydrogen peroxide   I am placing a referral to a plastic surgeon   Start Vitamin D3 2,000 units daily   Let's follow-up with any concerns   Take care,  Tinnie Harada, NP

## 2024-10-29 NOTE — Assessment & Plan Note (Signed)
 Left breast implant complications include pain, nodule, possible infection, and rash, with a rupture noted in 2019. We do not have access to her mammogram results in Epic. Prescribe Keflex 500mg  twice daily. Stop using hydrogen peroxide on the area. Wash with soap and water, dry, and apply Neosporin. Order mammogram and ultrasound at the breast center. Refer to a plastic surgeon for further evaluation and management.

## 2024-10-29 NOTE — Progress Notes (Signed)
 Acute Office Visit  Subjective:     Patient ID: Samantha Russo, female    DOB: November 05, 1961, 63 y.o.   MRN: [id number]  Chief Complaint  Patient presents with   Breast Pain    Left breast pain, ruptured breast implant 6 years ago    HPI Discussed the use of AI scribe software for clinical note transcription with the patient, who gave verbal consent to proceed.  History of Present Illness   Samantha Russo is a 63 year old female who presents with breast pain and concerns about a ruptured implant.  She had breast implants placed in 1994. In 2019, a mammogram showed a ruptured left breast implant. Due to the COVID-19 pandemic and relocation, she did not seek treatment. Approximately one month ago, she developed pain in her left breast, particularly over a large nodule beneath the skin. The pain intensified, waking her at night, and she observed a large, painful swelling. Self-treatment with warm compresses, leftover amoxicillin, hydrogen peroxide, antiseptic, and Neosporin provided some relief but led to oozing of a thick, white substance. She extracted 'nylon strings' from the area, suspecting they are related to the implant covering. The area remains tender, sensitive, and itchy, with pain rated at 4-5/10. No similar symptoms in the right breast.  In 2022, she experienced a heart attack and two TIAs. She has a 100% blockage of the right carotid artery and a 50% blockage of the left carotid artery. She is under cardiology and neurology care. She is concerned about the risks of implant removal surgery due to her cardiovascular history. She has not had a mammogram since 2019. Previously low vitamin D  levels were noted, and she inquired about supplementation.     ROS See pertinent positives and negatives per HPI.     Objective:    BP (!) 138/92 (BP Location: Left Arm, Patient Position: Sitting, Cuff Size: Large)   Pulse 91   Temp 98.1 F (36.7 C)   Ht 5' 3 (1.6 m)   LMP  (LMP  Unknown)   SpO2 97%   BMI 33.30 kg/m    Physical Exam Vitals and nursing note reviewed.  Constitutional:      General: She is not in acute distress.    Appearance: Normal appearance.  HENT:     Head: Normocephalic.  Eyes:     Conjunctiva/sclera: Conjunctivae normal.  Pulmonary:     Effort: Pulmonary effort is normal.  Chest:  Breasts:    Right: Normal.     Left: Skin change and tenderness present.  Musculoskeletal:     Cervical back: Normal range of motion.  Lymphadenopathy:     Upper Body:     Left upper body: No supraclavicular, axillary or pectoral adenopathy.  Skin:    General: Skin is warm.     Comments: Small pinpoint scabs covering left breast with some surrounding erythema. Nodule noted to left upper inner quadrant.   Neurological:     General: No focal deficit present.     Mental Status: She is alert and oriented to person, place, and time.  Psychiatric:        Mood and Affect: Mood normal.        Behavior: Behavior normal.        Thought Content: Thought content normal.        Judgment: Judgment normal.      Assessment & Plan:   Problem List Items Addressed This Visit       Other   Vitamin D   deficiency   She declines to have her labs checked today. Recommend she start a vitamin D  supplement OTC 2,000 units daily.       Breast pain - Primary   Left breast implant complications include pain, nodule, possible infection, and rash, with a rupture noted in 2019. We do not have access to her mammogram results in Epic. Prescribe Keflex 500mg  twice daily. Stop using hydrogen peroxide on the area. Wash with soap and water, dry, and apply Neosporin. Order mammogram and ultrasound at the breast center. Refer to a plastic surgeon for further evaluation and management.      Relevant Orders   MM 3D DIAGNOSTIC MAMMOGRAM BILATERAL BREAST W/IMPLANT   Ambulatory referral to Plastic Surgery   Other Visit Diagnoses       History of breast implant       Referral placed  to plastic surgery for evaluation.   Relevant Orders   Ambulatory referral to Plastic Surgery       Meds ordered this encounter  Medications   cephALEXin (KEFLEX) 500 MG capsule    Sig: Take 1 capsule (500 mg total) by mouth 2 (two) times daily.    Dispense:  20 capsule    Refill:  0    Return if symptoms worsen or fail to improve.  Tinnie DELENA Harada, NP

## 2024-11-09 ENCOUNTER — Institutional Professional Consult (permissible substitution): Payer: Self-pay | Admitting: Plastic Surgery

## 2024-11-13 ENCOUNTER — Institutional Professional Consult (permissible substitution): Payer: Self-pay

## 2024-12-08 ENCOUNTER — Ambulatory Visit: Admitting: Cardiology

## 2024-12-25 ENCOUNTER — Telehealth: Admitting: Family Medicine

## 2024-12-25 ENCOUNTER — Ambulatory Visit: Payer: Self-pay

## 2024-12-25 DIAGNOSIS — U071 COVID-19: Secondary | ICD-10-CM

## 2024-12-25 DIAGNOSIS — R110 Nausea: Secondary | ICD-10-CM

## 2024-12-25 MED ORDER — NIRMATRELVIR/RITONAVIR (PAXLOVID)TABLET: 3.0000 | ORAL_TABLET | Freq: Two times a day (BID) | ORAL | 0 refills | Status: AC

## 2024-12-25 MED ORDER — ONDANSETRON HCL 4 MG PO TABS: 4.0000 mg | ORAL_TABLET | Freq: Three times a day (TID) | ORAL | 0 refills | Status: AC | PRN

## 2024-12-25 NOTE — Telephone Encounter (Signed)
 FYI Only or Action Required?: FYI only for provider: appointment scheduled on virtual UC today.  Patient was last seen in primary care on 10/29/2024 by Nedra Tinnie LABOR, NP.  Called Nurse Triage reporting Covid Positive.  Symptoms began yesterday.  Interventions attempted: Nothing.  Symptoms are: gradually worsening.  Triage Disposition: Call PCP Within 24 Hours  Patient/caregiver understands and will follow disposition?: Yes  Copied from CRM #[id number]. Topic: Clinical - Red Word Triage >> Dec 25, 2024  9:16 AM Samantha Russo wrote: Red Word that prompted transfer to Nurse Triage: Patient tested positive for covid yesterday and is experiencing severe abdomen pain, nausea, headaches and dizziness and congestion Reason for Disposition  [1] HIGH RISK patient (e.g., weak immune system, 65 years and older, obesity with BMI 30 or higher, pregnant, chronic lung disease) AND [2] COVID symptoms (e.g., cough, fever)  (Exceptions: Already seen by PCP and no new or worsening symptoms.)  Answer Assessment - Initial Assessment Questions 1. SYMPTOMS: What is your main symptom or concern? (e.g., cough, fever, shortness of breath, muscle aches)     Congestion, abdominal pain, vomiting, diarrhea, head ache, chills, feels faint on standing 2. ONSET: When did the symptoms start?    One day ago 3. COUGH: Do you have a cough? If Yes, ask: How bad is the cough?       None reported 4. FEVER: Do you have a fever? If Yes, ask: What is your temperature, how was it measured, and when did it start?     No measured fever 5. BREATHING DIFFICULTY: Are you having any difficulty breathing? (e.g., normal; shortness of breath, wheezing, unable to speak)      denies 6. BETTER-SAME-WORSE: Are you getting better, staying the same or getting worse compared to yesterday?  If getting worse, ask, In what way?     Worse, abdominal pain  8. COVID-19 DIAGNOSIS: How do you know that you have COVID? (e.g.,  positive lab test or self-test, diagnosed by doctor or NP/PA, symptoms after exposure).     Positive home test 9. COVID-19 EXPOSURE: Was there any known exposure to COVID before the symptoms began?      Yes, four days before symptoms started  11. HIGH RISK DISEASE: Do you have any chronic medical problems? (e.g., asthma, heart or lung disease, weak immune system, obesity, etc.)       Hypertension, history of MI and three TIA's 12. PREGNANCY: Is there any chance you are pregnant? When was your last menstrual period?       N/A 13. O2 SATURATION MONITOR:  Do you use an oxygen saturation monitor (pulse oximeter) at home? If Yes, ask What is your reading (oxygen level) today? What is your usual oxygen saturation reading? (e.g., 95%)       Not measured  Protocols used: COVID-19 - Diagnosed or Suspected-A-AH

## 2024-12-25 NOTE — Telephone Encounter (Signed)
 Noted. Patient scheduled a virtual appointment.

## 2024-12-25 NOTE — Patient Instructions (Signed)
 COVID-19: What to Know COVID-19 is an infection caused by a virus called SARS-CoV-2. This type of virus is called a coronavirus. People with COVID-19 may: Have few to no symptoms. Have mild to moderate symptoms that affect their lungs and breathing. Get very sick. What are the causes?  COVID-19 is caused by a virus. This virus may be in the air as droplets or on surfaces. It can spread from an infected person when they cough, sneeze, speak, sing, or breathe. You may become infected if: You breathe in the infected droplets in the air. You touch an object that has the virus on it. What increases the risk? You are at risk of getting COVID-19 if you have been around someone with the infection. You may be more likely to get very sick if: You are 57 years old or older. You have certain medical conditions, such as: Heart disease. Diabetes. Long-term respiratory disease. Cancer. Pregnancy. You are immunocompromised. This means your body can't fight infections easily. You have a disability that makes it hard for you to move around, you have trouble moving, or you can't move at all. What are the signs or symptoms? People may have different symptoms from COVID-19. The symptoms can also be mild to very bad. They often show up in 5-6 days after being infected. But, they can take up to 14 days to appear. Common symptoms are: Cough. Feeling tired. New loss of taste or smell. Fever. Less common symptoms are: Sore throat. Headache. Body or muscle aches. Diarrhea. A skin rash or fingers or toes that are a different color than usual. Red or irritated eyes. Sometimes, COVID-19 does not cause symptoms. How is this diagnosed? COVID-19 can be diagnosed with tests done in the lab or at home. Fluid from your nose, mouth, or lungs will be used to check for the virus. How is this treated? Treatment for COVID-19 depends on how sick you are. Mild symptoms can be treated at home with rest, fluids, and  over-the-counter medicines. very bad symptoms may be treated in a hospital intensive care unit (ICU). If you have symptoms and are at risk of getting very sick, you may be given a medicine that fights viruses. This medicine is called an antiviral. How is this prevented? To protect yourself from COVID-19: Know your risk factors. Get vaccinated. If your body can't fight infections easily, talk to your provider about treatment to help prevent COVID-19. Stay at least about 3 feet (1 meter) away from other people. Wear mask that fits well when: You can't stay at a distance from people. You're in a place with not a lot of air flow. Try to be in open spaces with good air flow when you are in public. Wash your hands often or use an alcohol-based hand sanitizer. Cover your nose and mouth when you cough or sneeze. If you think you have COVID-19 or have been around someone who has it, stay home and away from other people as told by your provider or health officials. Where to find more information To learn more: Go to [employer] Click Health Topics. Type COVID-19 in the search box. Go to [employer].br Click Health Topics. Then click All Topics. Type COVID-19 in the search box. Get help right away if: You have trouble breathing or get short of breath. You have pain or pressure in your chest. You're feeling confused. These symptoms may be an emergency. Get help right away. Call 911. Do not wait to see if the symptoms will go away.  Do not drive yourself to the hospital. This information is not intended to replace advice given to you by your health care provider. Make sure you discuss any questions you have with your health care provider. Document Revised: 09/19/2023 Document Reviewed: 09/11/2023 Elsevier Patient Education  2025 [employer].

## 2024-12-25 NOTE — Progress Notes (Signed)
 " Virtual Visit Consent   [name], you are scheduled for a virtual visit with a [facility] provider today. Just as with appointments in the office, your consent must be obtained to participate. Your consent will be active for this visit and any virtual visit you may have with one of our providers in the next 365 days. If you have a MyChart account, a copy of this consent can be sent to you electronically.  As this is a virtual visit, video technology does not allow for your provider to perform a traditional examination. This may limit your provider's ability to fully assess your condition. If your provider identifies any concerns that need to be evaluated in person or the need to arrange testing (such as labs, EKG, etc.), we will make arrangements to do so. Although advances in technology are sophisticated, we cannot ensure that it will always work on either your end or our end. If the connection with a video visit is poor, the visit may have to be switched to a telephone visit. With either a video or telephone visit, we are not always able to ensure that we have a secure connection.  By engaging in this virtual visit, you consent to the provision of healthcare and authorize for your insurance to be billed (if applicable) for the services provided during this visit. Depending on your insurance coverage, you may receive a charge related to this service.  I need to obtain your verbal consent now. Are you willing to proceed with your visit today? Meygan Dzik has provided verbal consent on 12/25/2024 for a virtual visit (video or telephone). Loa Lamp, FNP  Date: 12/25/2024 4:36 PM   Virtual Visit via Video Note   I, Loa Lamp, connected with  Samantha Russo  ([id number], [birth year]) on 12/25/2024 at  4:00 PM EST by a video-enabled telemedicine application and verified that I am speaking with the correct person using two identifiers.  Location: Patient: Virtual Visit Location  Patient: Home Provider: Virtual Visit Location Provider: Home Office   I discussed the limitations of evaluation and management by telemedicine and the availability of in person appointments. The patient expressed understanding and agreed to proceed.    History of Present Illness: Samantha Russo is a 63 y.o. who identifies as a female who was assigned female at birth, and is being seen today for covid positive testing. 2 days ago started with abd pain, headache, head congestion. No wheezing or sob, vomited x 2 with diarrhea. Drinking broth and pedialyte. Tested positive for covid yesterday. Chills. No fever .  HPI: HPI  Problems:  Patient Active Problem List   Diagnosis Date Noted   Breast pain 10/29/2024   Vitamin D  deficiency 06/03/2023   Class 1 obesity due to excess calories with body mass index (BMI) of 33.0 to 33.9 in adult 05/31/2023   TIA (transient ischemic attack) 03/05/2023   Heart attack (HCC) 03/05/2023   Carotid artery occlusion 03/05/2023   History of shingles, recurrent 02/13/2022   Anxiety 02/13/2022   Chronic headaches 02/13/2022   Fatigue 02/13/2022   Tobacco use disorder, continuous 02/13/2022   Aortic atherosclerosis 02/13/2022   Left ventricular hypertrophy 02/13/2022   Breast implant leak 02/13/2022   Carotid arterial disease 08/23/2021   Dyslipidemia 08/23/2021   Amaurosis fugax of right eye 08/18/2021   Coronary artery disease completely occluded RCA with nonobstructive disease elsewhere based on coronary CT 08/08/2021   Atypical chest pain 07/25/2021   Essential hypertension 07/24/2021   Chronic neck and  back pain 07/24/2021   Greater trochanteric bursitis 03/11/2019   Cervical disc disorder with radiculopathy 08/09/2015   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   Resting comfortably  at home.  Head is normocephalic, atraumatic.  No labored breathing.  Speech is clear and coherent with logical content.  Patient is alert and oriented at baseline.    Assessment and Plan: 1. COVID (Primary)  2. Nausea  Increase fluids, humidifier at night, MVI with Vit D and zinc, quarantine discussed. See education sheet. UC if sx worsen.   Follow Up Instructions: I discussed the assessment and treatment plan with the patient. The patient was provided an opportunity to ask questions and all were answered. The patient agreed with the plan and demonstrated an understanding of the instructions.  A copy of instructions were sent to the patient via MyChart unless otherwise noted below.     The patient was advised to call back or seek an in-person evaluation if the symptoms worsen or if the condition fails to improve as anticipated.    Edwin Baines, FNP     [1] No Known Allergies [2]  Current Outpatient Medications:    nirmatrelvir /ritonavir  (PAXLOVID ) 20 x 150 MG & 10 x 100MG  TABS, Take 3 tablets by mouth 2 (two) times daily for 5 days. (Take nirmatrelvir  150 mg two tablets twice daily for 5 days and ritonavir  100 mg one tablet twice daily for 5 days) Patient GFR is normal, Disp: 30 tablet, Rfl: 0   aspirin  EC 81 MG tablet, Take 1 tablet (81 mg total) by mouth daily. Swallow whole., Disp: 90 tablet, Rfl: 3   cephALEXin  (KEFLEX ) 500 MG capsule, Take 1 capsule (500 mg total) by mouth 2 (two) times daily., Disp: 20 capsule, Rfl: 0   clopidogrel  (PLAVIX ) 75 MG tablet, TAKE 1 TABLET(75 MG) BY MOUTH DAILY, Disp: 90 tablet, Rfl: 3   diazepam  (VALIUM ) 5 MG tablet, Take 1 tablet (5 mg total) by mouth daily as needed for anxiety., Disp: 20 tablet, Rfl: 0   ezetimibe  (ZETIA ) 10 MG tablet, TAKE 1 TABLET(10 MG) BY MOUTH DAILY (Patient taking differently: Take 10 mg by mouth daily.), Disp: 90 tablet, Rfl: 1   isosorbide  mononitrate (IMDUR ) 60 MG 24 hr tablet, TAKE 1 TABLET(60  MG) BY MOUTH DAILY (Patient taking differently: Take 60 mg by mouth daily.), Disp: 90 tablet, Rfl: 1   nebivolol  (BYSTOLIC ) 5 MG tablet, Take 1 tablet (5 mg total) by mouth daily., Disp: 90 tablet, Rfl: 3   nitroGLYCERIN  (NITROLINGUAL ) 0.4 MG/SPRAY spray, Place 1 spray under the tongue every 5 (five) minutes x 3 doses as needed for chest pain., Disp: 30 g, Rfl: 11   ondansetron  (ZOFRAN ) 4 MG tablet, Take 1 tablet (4 mg total) by mouth every 8 (eight) hours as needed for nausea or vomiting., Disp: 20 tablet, Rfl: 0   rosuvastatin  (CRESTOR ) 40 MG tablet, Take 1 tablet (40 mg total) by mouth daily., Disp: 90 tablet, Rfl: 3   Semaglutide -Weight Management (WEGOVY ) 1 MG/0.5ML SOAJ, Inject 1 mg into the skin once a week., Disp: 6 mL, Rfl: 1   spironolactone  (ALDACTONE ) 25 MG tablet, Take 1 tablet (25 mg total) by mouth daily., Disp: 90 tablet, Rfl: 3   vitamin C (ASCORBIC ACID) 500 MG tablet, Take 500 mg by mouth daily., Disp: , Rfl:    Vitamin D , Ergocalciferol , (DRISDOL ) 1.25 MG (50000 UNIT) CAPS capsule,  TAKE 1 CAPSULE BY MOUTH EVERY 3 DAYS (Patient taking differently: Take 50,000 Units by mouth every 7 (seven) days.), Disp: 5 capsule, Rfl: 2  "

## 2025-02-01 ENCOUNTER — Other Ambulatory Visit: Payer: Self-pay

## 2025-02-01 MED ORDER — NEBIVOLOL HCL 5 MG PO TABS: 5.0000 mg | ORAL_TABLET | Freq: Every day | ORAL | 0 refills | Status: AC

## 2025-02-25 ENCOUNTER — Ambulatory Visit: Admitting: Cardiology
